# Patient Record
Sex: Female | Born: 1970
Health system: Southern US, Community
[De-identification: ages and names within clinical notes are randomized; demographics above are authoritative.]

## PROBLEM LIST (undated history)

## (undated) ENCOUNTER — Ambulatory Visit: Admission: EM | Payer: Self-pay | Source: Home / Self Care

## (undated) ENCOUNTER — Emergency Department (HOSPITAL_COMMUNITY): Payer: Self-pay | Source: Home / Self Care

## (undated) DIAGNOSIS — J189 Pneumonia, unspecified organism: Secondary | ICD-10-CM

## (undated) DIAGNOSIS — E7800 Pure hypercholesterolemia, unspecified: Secondary | ICD-10-CM

## (undated) DIAGNOSIS — R51 Headache: Secondary | ICD-10-CM

## (undated) DIAGNOSIS — I10 Essential (primary) hypertension: Secondary | ICD-10-CM

## (undated) DIAGNOSIS — E669 Obesity, unspecified: Secondary | ICD-10-CM

## (undated) DIAGNOSIS — E119 Type 2 diabetes mellitus without complications: Secondary | ICD-10-CM

## (undated) DIAGNOSIS — R519 Headache, unspecified: Secondary | ICD-10-CM

---

## 2002-03-07 HISTORY — PX: ABDOMINAL HYSTERECTOMY: SHX81

## 2015-02-14 ENCOUNTER — Encounter (HOSPITAL_COMMUNITY): Payer: Self-pay | Admitting: *Deleted

## 2015-02-14 ENCOUNTER — Emergency Department (HOSPITAL_COMMUNITY): Payer: Medicaid Other

## 2015-02-14 ENCOUNTER — Inpatient Hospital Stay (HOSPITAL_COMMUNITY)
Admission: EM | Admit: 2015-02-14 | Discharge: 2015-02-17 | DRG: 689 | Disposition: A | Discharge status: Home / Self Care | Payer: Medicaid Other | Attending: Internal Medicine | Admitting: Internal Medicine

## 2015-02-14 DIAGNOSIS — E669 Obesity, unspecified: Secondary | ICD-10-CM | POA: Diagnosis present

## 2015-02-14 DIAGNOSIS — I10 Essential (primary) hypertension: Secondary | ICD-10-CM | POA: Diagnosis present

## 2015-02-14 DIAGNOSIS — E1310 Other specified diabetes mellitus with ketoacidosis without coma: Secondary | ICD-10-CM

## 2015-02-14 DIAGNOSIS — K6819 Other retroperitoneal abscess: Secondary | ICD-10-CM | POA: Diagnosis present

## 2015-02-14 DIAGNOSIS — E876 Hypokalemia: Secondary | ICD-10-CM | POA: Diagnosis present

## 2015-02-14 DIAGNOSIS — Z23 Encounter for immunization: Secondary | ICD-10-CM

## 2015-02-14 DIAGNOSIS — N151 Renal and perinephric abscess: Secondary | ICD-10-CM | POA: Diagnosis present

## 2015-02-14 DIAGNOSIS — Z7984 Long term (current) use of oral hypoglycemic drugs: Secondary | ICD-10-CM

## 2015-02-14 DIAGNOSIS — E860 Dehydration: Secondary | ICD-10-CM | POA: Diagnosis present

## 2015-02-14 DIAGNOSIS — Z79899 Other long term (current) drug therapy: Secondary | ICD-10-CM | POA: Diagnosis not present

## 2015-02-14 DIAGNOSIS — E1110 Type 2 diabetes mellitus with ketoacidosis without coma: Secondary | ICD-10-CM

## 2015-02-14 DIAGNOSIS — Z6841 Body Mass Index (BMI) 40.0 and over, adult: Secondary | ICD-10-CM | POA: Diagnosis not present

## 2015-02-14 DIAGNOSIS — E871 Hypo-osmolality and hyponatremia: Secondary | ICD-10-CM

## 2015-02-14 DIAGNOSIS — L0291 Cutaneous abscess, unspecified: Secondary | ICD-10-CM

## 2015-02-14 DIAGNOSIS — E1165 Type 2 diabetes mellitus with hyperglycemia: Secondary | ICD-10-CM

## 2015-02-14 DIAGNOSIS — Z90710 Acquired absence of both cervix and uterus: Secondary | ICD-10-CM

## 2015-02-14 DIAGNOSIS — R109 Unspecified abdominal pain: Secondary | ICD-10-CM | POA: Diagnosis present

## 2015-02-14 HISTORY — DX: Essential (primary) hypertension: I10

## 2015-02-14 HISTORY — DX: Obesity, unspecified: E66.9

## 2015-02-14 LAB — BASIC METABOLIC PANEL
Anion gap: 18 — ABNORMAL HIGH (ref 5–15)
Anion gap: 13 (ref 5–15)
BUN: 6 mg/dL (ref 6–20)
BUN: 5 mg/dL — AB (ref 6–20)
CHLORIDE: 97 mmol/L — AB (ref 101–111)
CO2: 16 mmol/L — AB (ref 22–32)
CO2: 21 mmol/L — AB (ref 22–32)
Calcium: 8.7 mg/dL — ABNORMAL LOW (ref 8.9–10.3)
Calcium: 8.2 mg/dL — ABNORMAL LOW (ref 8.9–10.3)
Chloride: 97 mmol/L — ABNORMAL LOW (ref 101–111)
Creatinine, Ser: 0.96 mg/dL (ref 0.44–1.00)
Creatinine, Ser: 0.64 mg/dL (ref 0.44–1.00)
GFR calc Af Amer: >60 mL/min (ref >60)
GFR calc Af Amer: >60 mL/min (ref >60)
GFR calc non Af Amer: >60 mL/min (ref >60)
GLUCOSE: 259 mg/dL — AB (ref 65–99)
Glucose, Bld: 310 mg/dL — ABNORMAL HIGH (ref 65–99)
POTASSIUM: 3.1 mmol/L — AB (ref 3.5–5.1)
POTASSIUM: 3.2 mmol/L — AB (ref 3.5–5.1)
SODIUM: 131 mmol/L — AB (ref 135–145)
Sodium: 131 mmol/L — ABNORMAL LOW (ref 135–145)

## 2015-02-14 LAB — URINALYSIS, ROUTINE W REFLEX MICROSCOPIC
Hgb urine dipstick: NEGATIVE
Ketones, ur: >80 mg/dL — AB
LEUKOCYTES UA: NEGATIVE
NITRITE: NEGATIVE
PH: 5.5 (ref 5.0–8.0)
Protein, ur: 30 mg/dL — AB
SPECIFIC GRAVITY, URINE: 1.03 (ref 1.005–1.030)
Urobilinogen, UA: 1 mg/dL (ref 0.0–1.0)

## 2015-02-14 LAB — CBC
HEMATOCRIT: 41.2 % (ref 36.0–46.0)
Hemoglobin: 13.8 g/dL (ref 12.0–15.0)
MCH: 30.4 pg (ref 26.0–34.0)
MCHC: 33.5 g/dL (ref 30.0–36.0)
MCV: 90.7 fL (ref 78.0–100.0)
Platelets: 342 10*3/uL (ref 150–400)
RBC: 4.54 MIL/uL (ref 3.87–5.11)
RDW: 12.4 % (ref 11.5–15.5)
WBC: 12.9 10*3/uL — AB (ref 4.0–10.5)

## 2015-02-14 LAB — COMPREHENSIVE METABOLIC PANEL
ALT: 13 U/L — AB (ref 14–54)
AST: 19 U/L (ref 15–41)
Albumin: 2.5 g/dL — ABNORMAL LOW (ref 3.5–5.0)
Alkaline Phosphatase: 166 U/L — ABNORMAL HIGH (ref 38–126)
Anion gap: 21 — ABNORMAL HIGH (ref 5–15)
BUN: 9 mg/dL (ref 6–20)
CHLORIDE: 93 mmol/L — AB (ref 101–111)
CO2: 17 mmol/L — AB (ref 22–32)
Calcium: 9.4 mg/dL (ref 8.9–10.3)
Creatinine, Ser: 0.92 mg/dL (ref 0.44–1.00)
Glucose, Bld: 440 mg/dL — ABNORMAL HIGH (ref 65–99)
POTASSIUM: 4 mmol/L (ref 3.5–5.1)
SODIUM: 131 mmol/L — AB (ref 135–145)
Total Bilirubin: 1.4 mg/dL — ABNORMAL HIGH (ref 0.3–1.2)
Total Protein: 8.8 g/dL — ABNORMAL HIGH (ref 6.5–8.1)

## 2015-02-14 LAB — PROTIME-INR
INR: 1.35 (ref 0.00–1.49)
PROTHROMBIN TIME: 16.8 s — AB (ref 11.6–15.2)

## 2015-02-14 LAB — CBG MONITORING, ED
GLUCOSE-CAPILLARY: 239 mg/dL — AB (ref 65–99)
Glucose-Capillary: 436 mg/dL — ABNORMAL HIGH (ref 65–99)
Glucose-Capillary: 337 mg/dL — ABNORMAL HIGH (ref 65–99)
Glucose-Capillary: 351 mg/dL — ABNORMAL HIGH (ref 65–99)
Glucose-Capillary: 266 mg/dL — ABNORMAL HIGH (ref 65–99)
Glucose-Capillary: 250 mg/dL — ABNORMAL HIGH (ref 65–99)

## 2015-02-14 LAB — URINE MICROSCOPIC-ADD ON

## 2015-02-14 LAB — GLUCOSE, CAPILLARY
GLUCOSE-CAPILLARY: 236 mg/dL — AB (ref 65–99)
Glucose-Capillary: 250 mg/dL — ABNORMAL HIGH (ref 65–99)

## 2015-02-14 LAB — LIPASE, BLOOD: LIPASE: 25 U/L (ref 22–51)

## 2015-02-14 LAB — MAGNESIUM: Magnesium: 1.5 mg/dL — ABNORMAL LOW (ref 1.7–2.4)

## 2015-02-14 LAB — PHOSPHORUS: Phosphorus: 1.9 mg/dL — ABNORMAL LOW (ref 2.5–4.6)

## 2015-02-14 LAB — I-STAT BETA HCG BLOOD, ED (MC, WL, AP ONLY)

## 2015-02-14 MED ORDER — ONDANSETRON HCL 4 MG PO TABS: 4.0000 mg | ORAL_TABLET | Freq: Four times a day (QID) | ORAL | Status: DC | PRN

## 2015-02-14 MED ORDER — SODIUM CHLORIDE 0.9 % IV SOLN
INTRAVENOUS | Status: AC
  Administered 2015-02-14: 18:00:00 via INTRAVENOUS

## 2015-02-14 MED ORDER — ONDANSETRON HCL 4 MG/2ML IJ SOLN: 4.0000 mg | Freq: Four times a day (QID) | INTRAMUSCULAR | Status: DC | PRN

## 2015-02-14 MED ORDER — ACETAMINOPHEN 325 MG PO TABS: 650.0000 mg | ORAL_TABLET | Freq: Four times a day (QID) | ORAL | Status: DC | PRN

## 2015-02-14 MED ORDER — DEXTROSE-NACL 5-0.45 % IV SOLN: INTRAVENOUS | Status: DC

## 2015-02-14 MED ORDER — PIPERACILLIN-TAZOBACTAM 3.375 G IVPB
3.3750 g | Freq: Three times a day (TID) | INTRAVENOUS | Status: DC
  Administered 2015-02-14 – 2015-02-17 (×8): 3.375 g via INTRAVENOUS
  Filled 2015-02-14 (×13): qty 50

## 2015-02-14 MED ORDER — LISINOPRIL 10 MG PO TABS
10.0000 mg | ORAL_TABLET | Freq: Every day | ORAL | Status: DC
  Administered 2015-02-14: 10 mg via ORAL
  Filled 2015-02-14: qty 1

## 2015-02-14 MED ORDER — SODIUM CHLORIDE 0.9 % IV SOLN
INTRAVENOUS | Status: DC
  Administered 2015-02-14: 17:00:00 via INTRAVENOUS

## 2015-02-14 MED ORDER — PIPERACILLIN-TAZOBACTAM 3.375 G IVPB 30 MIN
3.3750 g | Freq: Once | INTRAVENOUS | Status: AC
  Administered 2015-02-14: 3.375 g via INTRAVENOUS
  Filled 2015-02-14: qty 50

## 2015-02-14 MED ORDER — SODIUM CHLORIDE 0.9 % IV SOLN
INTRAVENOUS | Status: DC
  Administered 2015-02-14: 2.1 [IU]/h via INTRAVENOUS
  Filled 2015-02-14: qty 2.5

## 2015-02-14 MED ORDER — HEPARIN SODIUM (PORCINE) 5000 UNIT/ML IJ SOLN
5000.0000 [IU] | Freq: Three times a day (TID) | INTRAMUSCULAR | Status: DC
  Administered 2015-02-14: 5000 [IU] via SUBCUTANEOUS
  Filled 2015-02-14: qty 1

## 2015-02-14 MED ORDER — POTASSIUM CHLORIDE 10 MEQ/100ML IV SOLN
10.0000 meq | INTRAVENOUS | Status: AC
Start: 2015-02-14 — End: 2015-02-14
  Administered 2015-02-14: 10 meq via INTRAVENOUS
  Filled 2015-02-14: qty 100

## 2015-02-14 MED ORDER — SODIUM CHLORIDE 0.9 % IV SOLN
INTRAVENOUS | Status: DC
  Administered 2015-02-14: 8.9 [IU]/h via INTRAVENOUS

## 2015-02-14 MED ORDER — HYDROCHLOROTHIAZIDE 12.5 MG PO CAPS
12.5000 mg | ORAL_CAPSULE | Freq: Every day | ORAL | Status: DC
  Administered 2015-02-14: 12.5 mg via ORAL
  Filled 2015-02-14: qty 1

## 2015-02-14 MED ORDER — IOHEXOL 300 MG/ML  SOLN
100.0000 mL | Freq: Once | INTRAMUSCULAR | Status: AC | PRN
  Administered 2015-02-14: 100 mL via INTRAVENOUS

## 2015-02-14 MED ORDER — DEXTROSE-NACL 5-0.45 % IV SOLN
INTRAVENOUS | Status: DC
  Administered 2015-02-14 – 2015-02-15 (×2): via INTRAVENOUS

## 2015-02-14 MED ORDER — LISINOPRIL-HYDROCHLOROTHIAZIDE 10-12.5 MG PO TABS: 1.0000 | ORAL_TABLET | Freq: Every day | ORAL | Status: DC

## 2015-02-14 MED ORDER — ONDANSETRON HCL 4 MG/2ML IJ SOLN
4.0000 mg | Freq: Once | INTRAMUSCULAR | Status: AC
  Administered 2015-02-14: 4 mg via INTRAVENOUS
  Filled 2015-02-14: qty 2

## 2015-02-14 MED ORDER — MORPHINE SULFATE (PF) 4 MG/ML IV SOLN
4.0000 mg | Freq: Once | INTRAVENOUS | Status: AC
  Administered 2015-02-14: 4 mg via INTRAVENOUS
  Filled 2015-02-14: qty 1

## 2015-02-14 MED ORDER — INSULIN ASPART 100 UNIT/ML ~~LOC~~ SOLN
10.0000 [IU] | Freq: Once | SUBCUTANEOUS | Status: AC
  Administered 2015-02-14: 10 [IU] via SUBCUTANEOUS
  Filled 2015-02-14: qty 1

## 2015-02-14 MED ORDER — ACETAMINOPHEN 650 MG RE SUPP: 650.0000 mg | Freq: Four times a day (QID) | RECTAL | Status: DC | PRN

## 2015-02-14 MED ORDER — OXYCODONE HCL 5 MG PO TABS
5.0000 mg | ORAL_TABLET | ORAL | Status: DC | PRN
  Administered 2015-02-14 – 2015-02-17 (×5): 5 mg via ORAL
  Filled 2015-02-14 (×5): qty 1

## 2015-02-14 NOTE — H&P (Signed)
[facility] History and Physical  Toyoko Silos ZOX:[id number] DOB: 1971-04-25 DOA: 02/14/2015  Referring physician: Dr Patria Mane - [facility] PCP: No primary care provider on file.   Chief Complaint: back pain  HPI: Mandy Garza is a 44 y.o. female  R flank and back pain. Started 3wks ago. Pain is constant with waxing and waning nature. Getting worse. Patient was started on Bactrim 4 days ago without symptoms. Patient states she's been compliant with her metformin and, prior but blood glucose is been around 600. Patient has had very little oral intake over the last couple of days due to persistent nausea. Endorses mild dysuria and frequency and subjective fevers.   Review of Systems:  Constitutional:  No weight loss, night sweats, Fevers HEENT:  No Difficulty swallowing,Tooth/dental problems,Sore throat, Cardio-vascular:  No chest pain, Orthopnea, PND, swelling in lower extremities, anasarca, dizziness, palpitations  GI: Per HPi Resp:   No shortness of breath with exertion or at rest. No excess mucus, no productive cough, No non-productive cough, No coughing up of blood.No change in color of mucus.No wheezing.No chest wall deformity  Skin:  no rash or lesions.  GU: Per HPI Musculoskeletal:   No joint pain or swelling. No decreased range of motion. No back pain.  Psych:  No change in mood or affect. No depression or anxiety. No memory loss.  Neuro:  No change in sensation, unilateral strength, or cognitive abilities  All other systems were reviewed and are negative.  Past Medical History  Diagnosis Date  . Obesity   . Hypertension   . Diabetes mellitus without complication [facility])    Past Surgical History  Procedure Laterality Date  . Abdominal hysterectomy     Social History:  reports that she has never smoked. She does not have any smokeless tobacco history on file. She reports that she does not drink alcohol or use illicit drugs.  No Known  Allergies  Family History  Problem Relation Age of Onset  . Diabetes Father   . Hypertension Father      Prior to Admission medications   Medication Sig Start Date End Date Taking? Authorizing Provider  diclofenac (VOLTAREN) 75 MG EC tablet Take 75 mg by mouth 2 (two) times daily.   Yes Historical Provider, MD  glimepiride (AMARYL) 2 MG tablet Take 2 mg by mouth daily with breakfast.   Yes Historical Provider, MD  lisinopril-hydrochlorothiazide (PRINZIDE,ZESTORETIC) 10-12.5 MG tablet Take 1 tablet by mouth daily.   Yes Historical Provider, MD  metFORMIN (GLUCOPHAGE) 1000 MG tablet Take 1,000 mg by mouth 2 (two) times daily with a meal.   Yes Historical Provider, MD   Physical Exam: Filed Vitals:   02/14/15 1115 02/14/15 1200 02/14/15 1230 02/14/15 1330  BP: 123/83 121/80 116/75 106/71  Pulse: 96 95 93 93  Temp:      TempSrc:      Resp:    16  SpO2: 100% 99% 97% 97%    Wt Readings from Last 3 Encounters:  No data found for Wt    General:  Appears calm and comfortable Eyes:  PERRL, EOMI, normal lids, iris ENT:  grossly normal hearing, lips & tongue Neck:  no LAD, masses or thyromegaly Cardiovascular:  RRR, no m/r/g. No LE edema.  Respiratory:  CTA bilaterally, no w/r/r. Normal respiratory effort. Abdomen:  soft, ntnd Skin:  no rash or induration seen on limited exam Musculoskeletal:  Right-sided CVA tenderness. Upper and lower extremity bilateral strength symmetric. Psychiatric:  grossly normal mood and affect, speech fluent  and appropriate Neurologic:  CN 2-12 grossly intact, moves all extremities in coordinated fashion.          Labs on Admission:  Basic Metabolic Panel:  Recent Labs Lab 02/14/15 1100  NA 131*  K 4.0  CL 93*  CO2 17*  GLUCOSE 440*  BUN 9  CREATININE 0.92  CALCIUM 9.4   Liver Function Tests:  Recent Labs Lab 02/14/15 1100  AST 19  ALT 13*  ALKPHOS 166*  BILITOT 1.4*  PROT 8.8*  ALBUMIN 2.5*    Recent Labs Lab 02/14/15 1100   LIPASE 25   No results for input(s): AMMONIA in the last 168 hours. CBC:  Recent Labs Lab 02/14/15 1100  WBC 12.9*  HGB 13.8  HCT 41.2  MCV 90.7  PLT 342   Cardiac Enzymes: No results for input(s): CKTOTAL, CKMB, CKMBINDEX, TROPONINI in the last 168 hours.  BNP (last 3 results) No results for input(s): BNP in the last 8760 hours.  ProBNP (last 3 results) No results for input(s): PROBNP in the last 8760 hours.  CBG:  Recent Labs Lab 02/14/15 1114 02/14/15 1422 02/14/15 1512 02/14/15 1629  GLUCAP 436* 337* 351* 266*    Radiological Exams on Admission: Ct Abdomen Pelvis Wo Contrast  02/14/2015   CLINICAL DATA:  Right-sided flank pain radiating to the right abdomen. Recent diagnosis of pyelonephritis.  EXAM: CT ABDOMEN AND PELVIS WITHOUT CONTRAST  TECHNIQUE: Multidetector CT imaging of the abdomen and pelvis was performed following the standard protocol without IV contrast.  COMPARISON:  None.  FINDINGS: The lack of intravenous contrast limits the ability to evaluate solid abdominal organs.  There is ill-defined masslike stranding about the posterior aspect of the inferior pole the right kidney with dominant component measuring at least 3.5 x 6.3 x 6.2 cm (as measured in greatest oblique axial - image 47, series 2 ; and coronal - image 65, series 4, dimensions respectively. This ill-defined stranding effaces the adjacent right iliopsoas musculature as well as the posterior inferior aspect of the right kidney. There is minimal pelviectasis involving the superior pole of the right kidney. No renal stones. No discrete areas of osteolysis involving the adjacent vertebral bodies. Adjacent vertebral body heights appear preserved.  Normal noncontrast appearance of the left kidney.  Normal hepatic contour. Normal noncontrast appearance of the gallbladder. No radiopaque gallstones. No ascites.  Note is made of an approximately 1.9 cm nodule within the crux of the right adrenal gland which  measures approximately 23 Hounsfield units and [place] indeterminate on this noncontrast examination (image 29, series 2). Normal noncontrast appearance of the left adrenal gland, pancreas and spleen.  Moderate colonic stool burden without evidence of enteric obstruction. The bowel is normal in course and caliber without wall thickening or evidence of obstruction. Normal noncontrast appearance of the retrocecal appendix. No pneumoperitoneum, pneumatosis or portal venous gas.  Scattered very minimal amount of atherosclerotic plaque within a normal caliber abdominal aorta.  Scattered shotty retroperitoneal lymph nodes are numerous though individually not enlarged by size criteria with index pre aortic lymph node measuring 0.5 cm in greatest short axis diameter. No definitive bulky retroperitoneal, mesenteric, pelvic or inguinal lymphadenopathy on this noncontrast examination.  There is a small amount of air seen within the urinary bladder (image 80, series 3), potentially the sequela of recent in and out Foley catheterization. Otherwise, normal noncontrast appearance of the urinary bladder. Post hysterectomy. No discrete adnexal lesions. No free fluid in the pelvic cul-de-sac.  Limited visualization the lower thorax is  negative for focal airspace opacity or pleural effusion.  Normal heart size.  No pericardial effusion.  No acute or aggressive osseous abnormalities. Stigmata of DISH within the imaged caudal aspect of the thoracic spine.  Regional soft tissues appear normal.  IMPRESSION: 1. Ill-defined masslike stranding involving the posterior inferior aspect the right kidney effacing the adjacent right iliopsoas musculature - while incompletely characterized on this noncontrast examination, given history of recent diagnosis of pyelonephritis, this is favored to represent a developing renal/perinephric abscess but may be further evaluated with contrast-enhanced abdominal CT as clinically indicated. 2. Very mild  pelviectasis involving the superior pole of the right kidney, likely secondary to mass effect from the adjacent infectious/inflammatory process. No renal stones. 3. Incidentally noted approximate nodule within the crux of the right adrenal gland, likely representative of a benign adrenal adenoma though incompletely characterized the present examination. This could be further evaluated with nonemergent MRI as clinically indicated. Critical Value/emergent results were called by telephone at the time of interpretation on 02/14/2015 at 1:16 pm to Dr. Azalia Bilis , who verbally acknowledged these results.   Electronically Signed   By: Simonne Come M.D.   On: 02/14/2015 13:19   Ct Abdomen Pelvis W Contrast  02/14/2015   CLINICAL DATA:  Possible perinephric abscess detected on non contrasted CT scan to further evaluate  EXAM: CT ABDOMEN AND PELVIS WITH CONTRAST  TECHNIQUE: Multidetector CT imaging of the abdomen and pelvis was performed using the standard protocol following bolus administration of intravenous contrast.  CONTRAST:  OMNIPAQUE IOHEXOL 300 MG/ML  SOLN  COMPARISON:  02/14/2015  FINDINGS: Lower chest:  No acute findings.  Hepatobiliary: Mild steatosis  Pancreas: No mass, inflammatory changes, or other significant abnormality.  Spleen: Within normal limits in size and appearance.  Adrenals/Urinary Tract: 19 mm right adrenal nodule shows postcontrast attenuation value of 54. 35 x 63 x 62 mm perinephric soft tissue abnormality on the right, inseparable from and causing mass effect upon the posterior cortex lower pole right kidney, also inseparable from right paraspinous soft tissues and right psoas musculature. Average attenuation value is 35 with numerous mildly enhancing internal septations. Surrounding inflammatory change noted as previously described. No hydronephrosis. Left kidney and adrenal gland normal. Trace air again noted in the bladder as previously described.  Stomach/Bowel: No evidence of  obstruction, inflammatory process, or abnormal fluid collections.  Vascular/Lymphatic: As previously described numerous nonpathologic retroperitoneal lymph nodes noted  Reproductive: No mass or other significant abnormality.  Other: None.  Musculoskeletal:  No suspicious bone lesions identified.  IMPRESSION: 1. Postcontrast images suggesting right perinephric abscess with extrinsic mass effect on the kidney although there is no hydronephrosis appreciated. Would suggest appropriate treatment with follow-up imaging to ensure resolution. 2. Indeterminate right adrenal nodule. As previously recommended, nonemergent MRI suggested.   Electronically Signed   By: Esperanza Heir M.D.   On: 02/14/2015 14:17      Assessment/Plan Principal Problem:   Perinephric abscess Active Problems:   Poorly controlled diabetes mellitus (HCC)   DKA (diabetic ketoacidoses) (HCC)   Hyponatremia   Essential hypertension   Perinephric abscess: Abscess is noted on CT. ED physician consult and urology and ventral radiology who graciously agreed to place a drain on 02/15/2015. Patient initially treated for UTI with Bactrim. - Tele - Nothing by mouth after midnight - Hold Heperin after 2200 - Follow-up IR recommendations and procedure - continue zosyn then taper coverage after drain - BCX - UCX  Diabetes: DKA. Anion gap 25. Bicarb 17.  Never previously on injectables but likely to need them after this hospitalization - glucose stabilizer - A1c - hold home metformin and glimepiride.   Hyponatremia: 131 on admission but corrects to 135 given glucose of 440.  - IVF - f/u BMET  HTN: - continue home HCTZ and lisinopril   Code Status: FULL  DVT Prophylaxis: Hep - SCD Family Communication: None Disposition Plan:  Pending Improvement    MERRELL, DAVID Shela Commons, MD Family Medicine [facility] [email or web address] Password [email or web address]

## 2015-02-14 NOTE — ED Notes (Signed)
Pt reports lower back pain that radiates around to lower abd and causes burning pain x 3 weeks.  Denies pain with urination but does having n/v when pain occurs. Went to pcp and started on medications 1 week ago but no relief.

## 2015-02-14 NOTE — ED Provider Notes (Signed)
CSN: [id number]     Arrival date & time 02/14/15  1041 History   First MD Initiated Contact with Patient 02/14/15 1109     Chief Complaint  Patient presents with  . Abdominal Pain  . Back Pain      HPI Presents to emergency department complaining of right flank pain x3 weeks. Started on bactrim 1 week ago for UTI. Ongoing subjective fevers at home and ongoing right flank pain. No hx of stones. Reports nausea and vomiting without diarrhea. Pt is a diabetic on metformin and she states her blood sugar yesterday was >600. Pain is moderate in severity. No CP or SOB. Some urinary frequency without dysuria     Past Medical History  Diagnosis Date  . Obesity   . Hypertension   . Diabetes mellitus without complication [facility])    Past Surgical History  Procedure Laterality Date  . Abdominal hysterectomy     History reviewed. No pertinent family history. Social History  Substance Use Topics  . Smoking status: Never Smoker   . Smokeless tobacco: None  . Alcohol Use: No   OB History    No data available     Review of Systems  All other systems reviewed and are negative.     Allergies  Review of patient's allergies indicates no known allergies.  Home Medications   Prior to Admission medications   Medication Sig Start Date End Date Taking? Authorizing Provider  diclofenac (VOLTAREN) 75 MG EC tablet Take 75 mg by mouth 2 (two) times daily.   Yes Historical Provider, MD  glimepiride (AMARYL) 2 MG tablet Take 2 mg by mouth daily with breakfast.   Yes Historical Provider, MD  lisinopril-hydrochlorothiazide (PRINZIDE,ZESTORETIC) 10-12.5 MG tablet Take 1 tablet by mouth daily.   Yes Historical Provider, MD  metFORMIN (GLUCOPHAGE) 1000 MG tablet Take 1,000 mg by mouth 2 (two) times daily with a meal.   Yes Historical Provider, MD   BP 106/71 mmHg  Pulse 93  Temp(Src) 98.5 F (36.9 C) (Oral)  Resp 16  SpO2 97% Physical Exam  Constitutional: She is oriented to person, place, and  time. She appears well-developed and well-nourished. No distress.  HENT:  Head: Normocephalic and atraumatic.  Eyes: EOM are normal.  Neck: Normal range of motion.  Cardiovascular: Normal rate, regular rhythm and normal heart sounds.   Pulmonary/Chest: Effort normal and breath sounds normal.  Abdominal: Soft. She exhibits no distension.  Mild right sided abdominal tenderness without guarding or rebound  Musculoskeletal: Normal range of motion.  Neurological: She is alert and oriented to person, place, and time.  Skin: Skin is warm and dry.  Psychiatric: She has a normal mood and affect. Judgment normal.  Nursing note and vitals reviewed.   ED Course  Procedures (including critical care time) Labs Review Labs Reviewed  COMPREHENSIVE METABOLIC PANEL - Abnormal; Notable for the following:    Sodium 131 (*)    Chloride 93 (*)    CO2 17 (*)    Glucose, Bld 440 (*)    Total Protein 8.8 (*)    Albumin 2.5 (*)    ALT 13 (*)    Alkaline Phosphatase 166 (*)    Total Bilirubin 1.4 (*)    Anion gap 21 (*)    All other components within normal limits  CBC - Abnormal; Notable for the following:    WBC 12.9 (*)    All other components within normal limits  URINALYSIS, ROUTINE W REFLEX MICROSCOPIC (NOT AT [facility]) - Abnormal;  Notable for the following:    Glucose, UA >1000 (*)    Bilirubin Urine MODERATE (*)    Ketones, ur >80 (*)    Protein, ur 30 (*)    All other components within normal limits  URINE MICROSCOPIC-ADD ON - Abnormal; Notable for the following:    Squamous Epithelial / LPF MANY (*)    Bacteria, UA FEW (*)    Casts GRANULAR CAST (*)    All other components within normal limits  CBG MONITORING, ED - Abnormal; Notable for the following:    Glucose-Capillary 436 (*)    All other components within normal limits  CBG MONITORING, ED - Abnormal; Notable for the following:    Glucose-Capillary 337 (*)    All other components within normal limits  CBG MONITORING, ED - Abnormal;  Notable for the following:    Glucose-Capillary 351 (*)    All other components within normal limits  URINE CULTURE  LIPASE, BLOOD  I-STAT BETA HCG BLOOD, ED ([facility], [facility], [facility] ONLY)    Imaging Review Ct Abdomen Pelvis Wo Contrast  02/14/2015   CLINICAL DATA:  Right-sided flank pain radiating to the right abdomen. Recent diagnosis of pyelonephritis.  EXAM: CT ABDOMEN AND PELVIS WITHOUT CONTRAST  TECHNIQUE: Multidetector CT imaging of the abdomen and pelvis was performed following the standard protocol without IV contrast.  COMPARISON:  None.  FINDINGS: The lack of intravenous contrast limits the ability to evaluate solid abdominal organs.  There is ill-defined masslike stranding about the posterior aspect of the inferior pole the right kidney with dominant component measuring at least 3.5 x 6.3 x 6.2 cm (as measured in greatest oblique axial - image 47, series 2 ; and coronal - image 65, series 4, dimensions respectively. This ill-defined stranding effaces the adjacent right iliopsoas musculature as well as the posterior inferior aspect of the right kidney. There is minimal pelviectasis involving the superior pole of the right kidney. No renal stones. No discrete areas of osteolysis involving the adjacent vertebral bodies. Adjacent vertebral body heights appear preserved.  Normal noncontrast appearance of the left kidney.  Normal hepatic contour. Normal noncontrast appearance of the gallbladder. No radiopaque gallstones. No ascites.  Note is made of an approximately 1.9 cm nodule within the crux of the right adrenal gland which measures approximately 23 Hounsfield units and [place] indeterminate on this noncontrast examination (image 29, series 2). Normal noncontrast appearance of the left adrenal gland, pancreas and spleen.  Moderate colonic stool burden without evidence of enteric obstruction. The bowel is normal in course and caliber without wall thickening or evidence of obstruction. Normal noncontrast appearance  of the retrocecal appendix. No pneumoperitoneum, pneumatosis or portal venous gas.  Scattered very minimal amount of atherosclerotic plaque within a normal caliber abdominal aorta.  Scattered shotty retroperitoneal lymph nodes are numerous though individually not enlarged by size criteria with index pre aortic lymph node measuring 0.5 cm in greatest short axis diameter. No definitive bulky retroperitoneal, mesenteric, pelvic or inguinal lymphadenopathy on this noncontrast examination.  There is a small amount of air seen within the urinary bladder (image 80, series 3), potentially the sequela of recent in and out Foley catheterization. Otherwise, normal noncontrast appearance of the urinary bladder. Post hysterectomy. No discrete adnexal lesions. No free fluid in the pelvic cul-de-sac.  Limited visualization the lower thorax is negative for focal airspace opacity or pleural effusion.  Normal heart size.  No pericardial effusion.  No acute or aggressive osseous abnormalities. Stigmata of DISH within the imaged  caudal aspect of the thoracic spine.  Regional soft tissues appear normal.  IMPRESSION: 1. Ill-defined masslike stranding involving the posterior inferior aspect the right kidney effacing the adjacent right iliopsoas musculature - while incompletely characterized on this noncontrast examination, given history of recent diagnosis of pyelonephritis, this is favored to represent a developing renal/perinephric abscess but may be further evaluated with contrast-enhanced abdominal CT as clinically indicated. 2. Very mild pelviectasis involving the superior pole of the right kidney, likely secondary to mass effect from the adjacent infectious/inflammatory process. No renal stones. 3. Incidentally noted approximate nodule within the crux of the right adrenal gland, likely representative of a benign adrenal adenoma though incompletely characterized the present examination. This could be further evaluated with  nonemergent MRI as clinically indicated. Critical Value/emergent results were called by telephone at the time of interpretation on 02/14/2015 at 1:16 pm to Dr. Azalia Bilis , who verbally acknowledged these results.   Electronically Signed   By: Simonne Come M.D.   On: 02/14/2015 13:19   Ct Abdomen Pelvis W Contrast  02/14/2015   CLINICAL DATA:  Possible perinephric abscess detected on non contrasted CT scan to further evaluate  EXAM: CT ABDOMEN AND PELVIS WITH CONTRAST  TECHNIQUE: Multidetector CT imaging of the abdomen and pelvis was performed using the standard protocol following bolus administration of intravenous contrast.  CONTRAST:  OMNIPAQUE IOHEXOL 300 MG/ML  SOLN  COMPARISON:  02/14/2015  FINDINGS: Lower chest:  No acute findings.  Hepatobiliary: Mild steatosis  Pancreas: No mass, inflammatory changes, or other significant abnormality.  Spleen: Within normal limits in size and appearance.  Adrenals/Urinary Tract: 19 mm right adrenal nodule shows postcontrast attenuation value of 54. 35 x 63 x 62 mm perinephric soft tissue abnormality on the right, inseparable from and causing mass effect upon the posterior cortex lower pole right kidney, also inseparable from right paraspinous soft tissues and right psoas musculature. Average attenuation value is 35 with numerous mildly enhancing internal septations. Surrounding inflammatory change noted as previously described. No hydronephrosis. Left kidney and adrenal gland normal. Trace air again noted in the bladder as previously described.  Stomach/Bowel: No evidence of obstruction, inflammatory process, or abnormal fluid collections.  Vascular/Lymphatic: As previously described numerous nonpathologic retroperitoneal lymph nodes noted  Reproductive: No mass or other significant abnormality.  Other: None.  Musculoskeletal:  No suspicious bone lesions identified.  IMPRESSION: 1. Postcontrast images suggesting right perinephric abscess with extrinsic mass  effect on the kidney although there is no hydronephrosis appreciated. Would suggest appropriate treatment with follow-up imaging to ensure resolution. 2. Indeterminate right adrenal nodule. As previously recommended, nonemergent MRI suggested.   Electronically Signed   By: Esperanza Heir M.D.   On: 02/14/2015 14:17   I have personally reviewed and evaluated these images and lab results as part of my medical decision-making.   EKG Interpretation None      MDM   Final diagnoses:  Perinephric abscess   hypergylcemia with right perinephric abscess. IV zosyn. Urine culture sent. Anion gap 21. IVFs and IV insulin. Will admit to the hospital    Azalia Bilis, MD 02/14/15 [phone]

## 2015-02-14 NOTE — ED Notes (Signed)
Paged Shelly Flatten to [place], RN

## 2015-02-15 ENCOUNTER — Inpatient Hospital Stay (HOSPITAL_COMMUNITY): Payer: Medicaid Other

## 2015-02-15 ENCOUNTER — Encounter (HOSPITAL_COMMUNITY): Payer: Self-pay | Admitting: Radiology

## 2015-02-15 DIAGNOSIS — N151 Renal and perinephric abscess: Principal | ICD-10-CM

## 2015-02-15 DIAGNOSIS — E0810 Diabetes mellitus due to underlying condition with ketoacidosis without coma: Secondary | ICD-10-CM

## 2015-02-15 DIAGNOSIS — E871 Hypo-osmolality and hyponatremia: Secondary | ICD-10-CM

## 2015-02-15 DIAGNOSIS — I10 Essential (primary) hypertension: Secondary | ICD-10-CM

## 2015-02-15 LAB — BASIC METABOLIC PANEL
ANION GAP: 14 (ref 5–15)
Anion gap: 12 (ref 5–15)
Anion gap: 13 (ref 5–15)
BUN: <5 mg/dL — ABNORMAL LOW (ref 6–20)
BUN: <5 mg/dL — ABNORMAL LOW (ref 6–20)
BUN: 6 mg/dL (ref 6–20)
CHLORIDE: 100 mmol/L — AB (ref 101–111)
CHLORIDE: 98 mmol/L — AB (ref 101–111)
CHLORIDE: 99 mmol/L — AB (ref 101–111)
CO2: 22 mmol/L (ref 22–32)
CO2: 20 mmol/L — ABNORMAL LOW (ref 22–32)
CO2: 20 mmol/L — ABNORMAL LOW (ref 22–32)
CREATININE: 0.55 mg/dL (ref 0.44–1.00)
CREATININE: 0.56 mg/dL (ref 0.44–1.00)
Calcium: 8.4 mg/dL — ABNORMAL LOW (ref 8.9–10.3)
Calcium: 8.4 mg/dL — ABNORMAL LOW (ref 8.9–10.3)
Calcium: 8.4 mg/dL — ABNORMAL LOW (ref 8.9–10.3)
Creatinine, Ser: 0.57 mg/dL (ref 0.44–1.00)
GFR calc non Af Amer: >60 mL/min (ref >60)
GFR calc non Af Amer: >60 mL/min (ref >60)
GFR calc non Af Amer: >60 mL/min (ref >60)
Glucose, Bld: 154 mg/dL — ABNORMAL HIGH (ref 65–99)
Glucose, Bld: 149 mg/dL — ABNORMAL HIGH (ref 65–99)
Glucose, Bld: 137 mg/dL — ABNORMAL HIGH (ref 65–99)
POTASSIUM: 2.9 mmol/L — AB (ref 3.5–5.1)
POTASSIUM: 3.3 mmol/L — AB (ref 3.5–5.1)
Potassium: 2.8 mmol/L — ABNORMAL LOW (ref 3.5–5.1)
SODIUM: 132 mmol/L — AB (ref 135–145)
SODIUM: 133 mmol/L — AB (ref 135–145)
SODIUM: 133 mmol/L — AB (ref 135–145)

## 2015-02-15 LAB — CBC
HCT: 36.9 % (ref 36.0–46.0)
HEMOGLOBIN: 12.6 g/dL (ref 12.0–15.0)
MCH: 30.4 pg (ref 26.0–34.0)
MCHC: 34.1 g/dL (ref 30.0–36.0)
MCV: 89.1 fL (ref 78.0–100.0)
Platelets: 297 10*3/uL (ref 150–400)
RBC: 4.14 MIL/uL (ref 3.87–5.11)
RDW: 12.5 % (ref 11.5–15.5)
WBC: 10.3 10*3/uL (ref 4.0–10.5)

## 2015-02-15 LAB — GLUCOSE, CAPILLARY
GLUCOSE-CAPILLARY: 238 mg/dL — AB (ref 65–99)
GLUCOSE-CAPILLARY: 170 mg/dL — AB (ref 65–99)
GLUCOSE-CAPILLARY: 156 mg/dL — AB (ref 65–99)
GLUCOSE-CAPILLARY: 152 mg/dL — AB (ref 65–99)
GLUCOSE-CAPILLARY: 165 mg/dL — AB (ref 65–99)
GLUCOSE-CAPILLARY: 146 mg/dL — AB (ref 65–99)
GLUCOSE-CAPILLARY: 150 mg/dL — AB (ref 65–99)
GLUCOSE-CAPILLARY: 167 mg/dL — AB (ref 65–99)
GLUCOSE-CAPILLARY: 291 mg/dL — AB (ref 65–99)
Glucose-Capillary: 135 mg/dL — ABNORMAL HIGH (ref 65–99)
Glucose-Capillary: 150 mg/dL — ABNORMAL HIGH (ref 65–99)
Glucose-Capillary: 153 mg/dL — ABNORMAL HIGH (ref 65–99)
Glucose-Capillary: 159 mg/dL — ABNORMAL HIGH (ref 65–99)
Glucose-Capillary: 209 mg/dL — ABNORMAL HIGH (ref 65–99)
Glucose-Capillary: 321 mg/dL — ABNORMAL HIGH (ref 65–99)

## 2015-02-15 LAB — COMPREHENSIVE METABOLIC PANEL
ALBUMIN: 2 g/dL — AB (ref 3.5–5.0)
ALT: 12 U/L — ABNORMAL LOW (ref 14–54)
ANION GAP: 10 (ref 5–15)
AST: 19 U/L (ref 15–41)
Alkaline Phosphatase: 153 U/L — ABNORMAL HIGH (ref 38–126)
BILIRUBIN TOTAL: 0.5 mg/dL (ref 0.3–1.2)
BUN: <5 mg/dL — ABNORMAL LOW (ref 6–20)
CO2: 25 mmol/L (ref 22–32)
Calcium: 8.3 mg/dL — ABNORMAL LOW (ref 8.9–10.3)
Chloride: 98 mmol/L — ABNORMAL LOW (ref 101–111)
Creatinine, Ser: 0.58 mg/dL (ref 0.44–1.00)
Glucose, Bld: 140 mg/dL — ABNORMAL HIGH (ref 65–99)
POTASSIUM: 2.7 mmol/L — AB (ref 3.5–5.1)
Sodium: 133 mmol/L — ABNORMAL LOW (ref 135–145)
TOTAL PROTEIN: 7.3 g/dL (ref 6.5–8.1)

## 2015-02-15 LAB — MAGNESIUM: Magnesium: 1.6 mg/dL — ABNORMAL LOW (ref 1.7–2.4)

## 2015-02-15 LAB — URINE CULTURE

## 2015-02-15 MED ORDER — INSULIN ASPART 100 UNIT/ML ~~LOC~~ SOLN
0.0000 [IU] | Freq: Three times a day (TID) | SUBCUTANEOUS | Status: DC
  Administered 2015-02-15: 7 [IU] via SUBCUTANEOUS
  Administered 2015-02-15: 1 [IU] via SUBCUTANEOUS
  Administered 2015-02-16 (×2): 7 [IU] via SUBCUTANEOUS

## 2015-02-15 MED ORDER — MIDAZOLAM HCL 2 MG/2ML IJ SOLN
INTRAMUSCULAR | Status: AC | PRN
  Administered 2015-02-15: 1 mg via INTRAVENOUS

## 2015-02-15 MED ORDER — HEPARIN SODIUM (PORCINE) 5000 UNIT/ML IJ SOLN
5000.0000 [IU] | Freq: Three times a day (TID) | INTRAMUSCULAR | Status: DC
  Administered 2015-02-15 – 2015-02-17 (×6): 5000 [IU] via SUBCUTANEOUS
  Filled 2015-02-15 (×5): qty 1

## 2015-02-15 MED ORDER — LIDOCAINE HCL 1 % IJ SOLN
INTRAMUSCULAR | Status: AC
  Filled 2015-02-15: qty 20

## 2015-02-15 MED ORDER — SODIUM CHLORIDE 0.9 % IV SOLN
INTRAVENOUS | Status: DC
  Administered 2015-02-15: 10:00:00 via INTRAVENOUS
  Filled 2015-02-15 (×5): qty 1000

## 2015-02-15 MED ORDER — FENTANYL CITRATE (PF) 100 MCG/2ML IJ SOLN
INTRAMUSCULAR | Status: AC
  Filled 2015-02-15: qty 2

## 2015-02-15 MED ORDER — MIDAZOLAM HCL 2 MG/2ML IJ SOLN
INTRAMUSCULAR | Status: AC
Start: 2015-02-15 — End: 2015-02-15
  Filled 2015-02-15: qty 2

## 2015-02-15 MED ORDER — FENTANYL CITRATE (PF) 100 MCG/2ML IJ SOLN
INTRAMUSCULAR | Status: AC | PRN
  Administered 2015-02-15: 50 ug via INTRAVENOUS

## 2015-02-15 MED ORDER — MAGNESIUM SULFATE 2 GM/50ML IV SOLN
2.0000 g | Freq: Once | INTRAVENOUS | Status: AC
  Administered 2015-02-15: 2 g via INTRAVENOUS
  Filled 2015-02-15 (×2): qty 50

## 2015-02-15 MED ORDER — POTASSIUM CHLORIDE CRYS ER 20 MEQ PO TBCR
40.0000 meq | EXTENDED_RELEASE_TABLET | Freq: Three times a day (TID) | ORAL | Status: AC
  Administered 2015-02-15 – 2015-02-16 (×3): 40 meq via ORAL
  Filled 2015-02-15 (×2): qty 2

## 2015-02-15 MED ORDER — INSULIN REGULAR HUMAN 100 UNIT/ML IJ SOLN
INTRAMUSCULAR | Status: DC
  Filled 2015-02-15: qty 2.5

## 2015-02-15 MED ORDER — INSULIN GLARGINE 100 UNIT/ML ~~LOC~~ SOLN
20.0000 [IU] | Freq: Every day | SUBCUTANEOUS | Status: DC
  Administered 2015-02-15: 20 [IU] via SUBCUTANEOUS
  Filled 2015-02-15 (×4): qty 0.2

## 2015-02-15 NOTE — Procedures (Signed)
Interventional Radiology Procedure Note  Procedure:  CT guided drainage of right perinephric/retroperitoneal abscess  Complications:  None  Estimated Blood Loss: < 10 mL  Grossly purulent, thick fluid aspirated from posterior right perinephric abscess. 12 Fr drain placed.  Attached to suction bulb. Will follow output. Recommend ID consultation.  If patient discharged with drain, we can follow her in IR Clinic. Will follow.  Jodi Marble. Fredia Sorrow, M.D Pager:  [phone]

## 2015-02-15 NOTE — Progress Notes (Signed)
CRITICAL VALUE ALERT  Critical value received:  K 2.7  Date of notification:  02/15/15  Time of notification:  1242  Critical value read back:Yes.    Nurse who received alert:  Lujean Amel  MD notified (1st page):  N.Abrol  Time of first page:  1244  Responding MD:  N.Abrol   Time MD responded:  1246- New orders placed.

## 2015-02-15 NOTE — Sedation Documentation (Signed)
Patient is resting comfortably. 

## 2015-02-15 NOTE — Progress Notes (Signed)
[facility] PROGRESS NOTE  Mandy Garza VWU:[id number] DOB: 07-Nov-1970 DOA: 02/14/2015 PCP: No primary care provider on file.  Length of stay: 1   Assessment/Plan: Principal Problem:   Perinephric abscess Active Problems:   Poorly controlled diabetes mellitus (HCC)   DKA (diabetic ketoacidoses) (HCC)   Hyponatremia   Essential hypertension    Perinephric abscess-continue IV Zosyn, continued follow cultures, continue IV hydration, interventional radiology to place a drain, to drain the abscess today  Diabetic ketoacidosis-anion gap closed, will check hemoglobin A1c, discontinue insulin drip in 2-3 hours, will give 1 dose of Lantus now  Hypokalemia-replete and check magnesium level  Hyponatremia secondary to dehydration, now improving with IV fluids  Hypertension discontinue antihypertensives given soft blood pressure   DVT prophylaxsis , start heparin after procedure   Code Status:      Code Status Orders        Start     Ordered   02/14/15 1637  Full code   Continuous     02/14/15 1637     Family Communication: family updated about patient's clinical progress Disposition Plan:   anticipate discharge in one to 2 days    Brief narrative: 44 y.o. female  R flank and back pain. Started 3wks ago. Pain is constant with waxing and waning nature. Getting worse. Patient was started on Bactrim 4 days ago without symptoms. Patient states she's been compliant with her metformin and, prior but blood glucose is been around 600. Patient has had very little oral intake over the last couple of days due to persistent nausea. Endorses mild dysuria and frequency and subjective fevers  Consultants:  Interventional radiology  Procedures:  None  Antibiotics: Anti-infectives    Start     Dose/Rate Route Frequency Ordered Stop   02/14/15 2200  piperacillin-tazobactam (ZOSYN) IVPB 3.375 g     3.375 g 12.5 mL/hr over 240 Minutes Intravenous 3 times per day  02/14/15 2146     02/14/15 1330  piperacillin-tazobactam (ZOSYN) IVPB 3.375 g     3.375 g 100 mL/hr over 30 Minutes Intravenous  Once 02/14/15 1320 02/14/15 1504         HPI/Subjective: Patient complaining of mild nausea and right flank pain, afebrile overnight  Objective: Filed Vitals:   02/14/15 1830 02/14/15 1845 02/14/15 1947 02/15/15 0445  BP:   118/67 92/52  Pulse: 92 93 93 95  Temp:   98.5 F (36.9 C) 98.5 F (36.9 C)  TempSrc:   Oral Oral  Resp:   18 19  Height:   4\' 8"  (1.422 m)   Weight:   99.6 kg (219 lb 9.3 oz)   SpO2:   96% 99%    Intake/Output Summary (Last 24 hours) at 02/15/15 0956 Last data filed at 02/15/15 0600  Gross per 24 hour  Intake 1766.73 ml  Output      0 ml  Net 1766.73 ml    Exam:  General: No acute respiratory distress Lungs: Clear to auscultation bilaterally without wheezes or crackles Cardiovascular: Regular rate and rhythm without murmur gallop or rub normal S1 and S2 Abdomen: Nontender, nondistended, soft, bowel sounds positive, no rebound, no ascites, no appreciable mass Extremities: No significant cyanosis, clubbing, or edema bilateral lower extremities     Data Review   Micro Results No results found for this or any previous visit (from the past 240 hour(s)).  Radiology Reports Ct Abdomen Pelvis Wo Contrast  02/14/2015   CLINICAL DATA:  Right-sided flank pain  radiating to the right abdomen. Recent diagnosis of pyelonephritis.  EXAM: CT ABDOMEN AND PELVIS WITHOUT CONTRAST  TECHNIQUE: Multidetector CT imaging of the abdomen and pelvis was performed following the standard protocol without IV contrast.  COMPARISON:  None.  FINDINGS: The lack of intravenous contrast limits the ability to evaluate solid abdominal organs.  There is ill-defined masslike stranding about the posterior aspect of the inferior pole the right kidney with dominant component measuring at least 3.5 x 6.3 x 6.2 cm (as measured in greatest oblique axial - image  47, series 2 ; and coronal - image 65, series 4, dimensions respectively. This ill-defined stranding effaces the adjacent right iliopsoas musculature as well as the posterior inferior aspect of the right kidney. There is minimal pelviectasis involving the superior pole of the right kidney. No renal stones. No discrete areas of osteolysis involving the adjacent vertebral bodies. Adjacent vertebral body heights appear preserved.  Normal noncontrast appearance of the left kidney.  Normal hepatic contour. Normal noncontrast appearance of the gallbladder. No radiopaque gallstones. No ascites.  Note is made of an approximately 1.9 cm nodule within the crux of the right adrenal gland which measures approximately 23 Hounsfield units and [place] indeterminate on this noncontrast examination (image 29, series 2). Normal noncontrast appearance of the left adrenal gland, pancreas and spleen.  Moderate colonic stool burden without evidence of enteric obstruction. The bowel is normal in course and caliber without wall thickening or evidence of obstruction. Normal noncontrast appearance of the retrocecal appendix. No pneumoperitoneum, pneumatosis or portal venous gas.  Scattered very minimal amount of atherosclerotic plaque within a normal caliber abdominal aorta.  Scattered shotty retroperitoneal lymph nodes are numerous though individually not enlarged by size criteria with index pre aortic lymph node measuring 0.5 cm in greatest short axis diameter. No definitive bulky retroperitoneal, mesenteric, pelvic or inguinal lymphadenopathy on this noncontrast examination.  There is a small amount of air seen within the urinary bladder (image 80, series 3), potentially the sequela of recent in and out Foley catheterization. Otherwise, normal noncontrast appearance of the urinary bladder. Post hysterectomy. No discrete adnexal lesions. No free fluid in the pelvic cul-de-sac.  Limited visualization the lower thorax is negative for focal  airspace opacity or pleural effusion.  Normal heart size.  No pericardial effusion.  No acute or aggressive osseous abnormalities. Stigmata of DISH within the imaged caudal aspect of the thoracic spine.  Regional soft tissues appear normal.  IMPRESSION: 1. Ill-defined masslike stranding involving the posterior inferior aspect the right kidney effacing the adjacent right iliopsoas musculature - while incompletely characterized on this noncontrast examination, given history of recent diagnosis of pyelonephritis, this is favored to represent a developing renal/perinephric abscess but may be further evaluated with contrast-enhanced abdominal CT as clinically indicated. 2. Very mild pelviectasis involving the superior pole of the right kidney, likely secondary to mass effect from the adjacent infectious/inflammatory process. No renal stones. 3. Incidentally noted approximate nodule within the crux of the right adrenal gland, likely representative of a benign adrenal adenoma though incompletely characterized the present examination. This could be further evaluated with nonemergent MRI as clinically indicated. Critical Value/emergent results were called by telephone at the time of interpretation on 02/14/2015 at 1:16 pm to Dr. Azalia Bilis , who verbally acknowledged these results.   Electronically Signed   By: Simonne Come M.D.   On: 02/14/2015 13:19   Ct Abdomen Pelvis W Contrast  02/14/2015   CLINICAL DATA:  Possible perinephric abscess detected on non contrasted  CT scan to further evaluate  EXAM: CT ABDOMEN AND PELVIS WITH CONTRAST  TECHNIQUE: Multidetector CT imaging of the abdomen and pelvis was performed using the standard protocol following bolus administration of intravenous contrast.  CONTRAST:  OMNIPAQUE IOHEXOL 300 MG/ML  SOLN  COMPARISON:  02/14/2015  FINDINGS: Lower chest:  No acute findings.  Hepatobiliary: Mild steatosis  Pancreas: No mass, inflammatory changes, or other significant abnormality.   Spleen: Within normal limits in size and appearance.  Adrenals/Urinary Tract: 19 mm right adrenal nodule shows postcontrast attenuation value of 54. 35 x 63 x 62 mm perinephric soft tissue abnormality on the right, inseparable from and causing mass effect upon the posterior cortex lower pole right kidney, also inseparable from right paraspinous soft tissues and right psoas musculature. Average attenuation value is 35 with numerous mildly enhancing internal septations. Surrounding inflammatory change noted as previously described. No hydronephrosis. Left kidney and adrenal gland normal. Trace air again noted in the bladder as previously described.  Stomach/Bowel: No evidence of obstruction, inflammatory process, or abnormal fluid collections.  Vascular/Lymphatic: As previously described numerous nonpathologic retroperitoneal lymph nodes noted  Reproductive: No mass or other significant abnormality.  Other: None.  Musculoskeletal:  No suspicious bone lesions identified.  IMPRESSION: 1. Postcontrast images suggesting right perinephric abscess with extrinsic mass effect on the kidney although there is no hydronephrosis appreciated. Would suggest appropriate treatment with follow-up imaging to ensure resolution. 2. Indeterminate right adrenal nodule. As previously recommended, nonemergent MRI suggested.   Electronically Signed   By: Esperanza Heir M.D.   On: 02/14/2015 14:17     CBC  Recent Labs Lab 02/14/15 1100 02/15/15 0431  WBC 12.9* 10.3  HGB 13.8 12.6  HCT 41.2 36.9  PLT 342 297  MCV 90.7 89.1  MCH 30.4 30.4  MCHC 33.5 34.1  RDW 12.4 12.5    Chemistries   Recent Labs Lab 02/14/15 1100 02/14/15 1641 02/14/15 1642 02/14/15 2025 02/15/15 0013 02/15/15 0431  NA 131* 131*  --  131* 132* 133*  K 4.0 3.1*  --  3.2* 2.9* 2.8*  CL 93* 97*  --  97* 98* 100*  CO2 17* 16*  --  21* 22 20*  GLUCOSE 440* 310*  --  259* 154* 149*  BUN 9 6  --  5* <5* <5*  CREATININE 0.92 0.96  --  0.64 0.55 0.56   CALCIUM 9.4 8.7*  --  8.2* 8.4* 8.4*  MG  --   --  1.5*  --   --   --   AST 19  --   --   --   --   --   ALT 13*  --   --   --   --   --   ALKPHOS 166*  --   --   --   --   --   BILITOT 1.4*  --   --   --   --   --    ------------------------------------------------------------------------------------------------------------------ estimated creatinine clearance is 88.2 mL/min (by C-G formula based on Cr of 0.56). ------------------------------------------------------------------------------------------------------------------ No results for input(s): HGBA1C in the last 72 hours. ------------------------------------------------------------------------------------------------------------------ No results for input(s): CHOL, HDL, LDLCALC, TRIG, CHOLHDL, LDLDIRECT in the last 72 hours. ------------------------------------------------------------------------------------------------------------------ No results for input(s): TSH, T4TOTAL, T3FREE, THYROIDAB in the last 72 hours.  Invalid input(s): FREET3 ------------------------------------------------------------------------------------------------------------------ No results for input(s): VITAMINB12, FOLATE, FERRITIN, TIBC, IRON, RETICCTPCT in the last 72 hours.  Coagulation profile  Recent Labs Lab 02/14/15 1641  INR 1.35  No results for input(s): DDIMER in the last 72 hours.  Cardiac Enzymes No results for input(s): CKMB, TROPONINI, MYOGLOBIN in the last 168 hours.  Invalid input(s): CK ------------------------------------------------------------------------------------------------------------------ Invalid input(s): POCBNP   CBG:  Recent Labs Lab 02/15/15 0440 02/15/15 0541 02/15/15 0643 02/15/15 0743 02/15/15 0851  GLUCAP 152* 165* 146* 150* 167*       Studies: Ct Abdomen Pelvis Wo Contrast  02/14/2015   CLINICAL DATA:  Right-sided flank pain radiating to the right abdomen. Recent diagnosis of  pyelonephritis.  EXAM: CT ABDOMEN AND PELVIS WITHOUT CONTRAST  TECHNIQUE: Multidetector CT imaging of the abdomen and pelvis was performed following the standard protocol without IV contrast.  COMPARISON:  None.  FINDINGS: The lack of intravenous contrast limits the ability to evaluate solid abdominal organs.  There is ill-defined masslike stranding about the posterior aspect of the inferior pole the right kidney with dominant component measuring at least 3.5 x 6.3 x 6.2 cm (as measured in greatest oblique axial - image 47, series 2 ; and coronal - image 65, series 4, dimensions respectively. This ill-defined stranding effaces the adjacent right iliopsoas musculature as well as the posterior inferior aspect of the right kidney. There is minimal pelviectasis involving the superior pole of the right kidney. No renal stones. No discrete areas of osteolysis involving the adjacent vertebral bodies. Adjacent vertebral body heights appear preserved.  Normal noncontrast appearance of the left kidney.  Normal hepatic contour. Normal noncontrast appearance of the gallbladder. No radiopaque gallstones. No ascites.  Note is made of an approximately 1.9 cm nodule within the crux of the right adrenal gland which measures approximately 23 Hounsfield units and [place] indeterminate on this noncontrast examination (image 29, series 2). Normal noncontrast appearance of the left adrenal gland, pancreas and spleen.  Moderate colonic stool burden without evidence of enteric obstruction. The bowel is normal in course and caliber without wall thickening or evidence of obstruction. Normal noncontrast appearance of the retrocecal appendix. No pneumoperitoneum, pneumatosis or portal venous gas.  Scattered very minimal amount of atherosclerotic plaque within a normal caliber abdominal aorta.  Scattered shotty retroperitoneal lymph nodes are numerous though individually not enlarged by size criteria with index pre aortic lymph node measuring 0.5 cm  in greatest short axis diameter. No definitive bulky retroperitoneal, mesenteric, pelvic or inguinal lymphadenopathy on this noncontrast examination.  There is a small amount of air seen within the urinary bladder (image 80, series 3), potentially the sequela of recent in and out Foley catheterization. Otherwise, normal noncontrast appearance of the urinary bladder. Post hysterectomy. No discrete adnexal lesions. No free fluid in the pelvic cul-de-sac.  Limited visualization the lower thorax is negative for focal airspace opacity or pleural effusion.  Normal heart size.  No pericardial effusion.  No acute or aggressive osseous abnormalities. Stigmata of DISH within the imaged caudal aspect of the thoracic spine.  Regional soft tissues appear normal.  IMPRESSION: 1. Ill-defined masslike stranding involving the posterior inferior aspect the right kidney effacing the adjacent right iliopsoas musculature - while incompletely characterized on this noncontrast examination, given history of recent diagnosis of pyelonephritis, this is favored to represent a developing renal/perinephric abscess but may be further evaluated with contrast-enhanced abdominal CT as clinically indicated. 2. Very mild pelviectasis involving the superior pole of the right kidney, likely secondary to mass effect from the adjacent infectious/inflammatory process. No renal stones. 3. Incidentally noted approximate nodule within the crux of the right adrenal gland, likely representative of a benign adrenal adenoma though incompletely  characterized the present examination. This could be further evaluated with nonemergent MRI as clinically indicated. Critical Value/emergent results were called by telephone at the time of interpretation on 02/14/2015 at 1:16 pm to Dr. Azalia Bilis , who verbally acknowledged these results.   Electronically Signed   By: Simonne Come M.D.   On: 02/14/2015 13:19   Ct Abdomen Pelvis W Contrast  02/14/2015   CLINICAL DATA:   Possible perinephric abscess detected on non contrasted CT scan to further evaluate  EXAM: CT ABDOMEN AND PELVIS WITH CONTRAST  TECHNIQUE: Multidetector CT imaging of the abdomen and pelvis was performed using the standard protocol following bolus administration of intravenous contrast.  CONTRAST:  OMNIPAQUE IOHEXOL 300 MG/ML  SOLN  COMPARISON:  02/14/2015  FINDINGS: Lower chest:  No acute findings.  Hepatobiliary: Mild steatosis  Pancreas: No mass, inflammatory changes, or other significant abnormality.  Spleen: Within normal limits in size and appearance.  Adrenals/Urinary Tract: 19 mm right adrenal nodule shows postcontrast attenuation value of 54. 35 x 63 x 62 mm perinephric soft tissue abnormality on the right, inseparable from and causing mass effect upon the posterior cortex lower pole right kidney, also inseparable from right paraspinous soft tissues and right psoas musculature. Average attenuation value is 35 with numerous mildly enhancing internal septations. Surrounding inflammatory change noted as previously described. No hydronephrosis. Left kidney and adrenal gland normal. Trace air again noted in the bladder as previously described.  Stomach/Bowel: No evidence of obstruction, inflammatory process, or abnormal fluid collections.  Vascular/Lymphatic: As previously described numerous nonpathologic retroperitoneal lymph nodes noted  Reproductive: No mass or other significant abnormality.  Other: None.  Musculoskeletal:  No suspicious bone lesions identified.  IMPRESSION: 1. Postcontrast images suggesting right perinephric abscess with extrinsic mass effect on the kidney although there is no hydronephrosis appreciated. Would suggest appropriate treatment with follow-up imaging to ensure resolution. 2. Indeterminate right adrenal nodule. As previously recommended, nonemergent MRI suggested.   Electronically Signed   By: Esperanza Heir M.D.   On: 02/14/2015 14:17      No results found for:  HGBA1C Lab Results  Component Value Date   CREATININE 0.56 02/15/2015       Scheduled Meds: . insulin aspart  0-9 Units Subcutaneous TID WC  . insulin glargine  20 Units Subcutaneous Daily  . piperacillin-tazobactam (ZOSYN)  IV  3.375 g Intravenous 3 times per day   Continuous Infusions: . dextrose 5 % and 0.45% NaCl 125 mL/hr at 02/15/15 0537  . insulin (NOVOLIN-R) infusion    . 0.9 % sodium chloride with kcl      Principal Problem:   Perinephric abscess Active Problems:   Poorly controlled diabetes mellitus (HCC)   DKA (diabetic ketoacidoses) (HCC)   Hyponatremia   Essential hypertension    Time spent: 45 minutes   [facility]  [facility] Pager [phone]. If 7PM-7AM, please contact night-coverage at [email or web address], password [email or web address] 02/15/2015, 9:56 AM  LOS: 1 day

## 2015-02-15 NOTE — H&P (Signed)
Chief Complaint: Patient was seen in consultation today for right perinephric/psoas fluid collection Chief Complaint  Patient presents with  . Abdominal Pain  . Back Pain   at the request of [facility]  Referring Physician(s): [facility] Dr. Susie Cassette  History of Present Illness: Mandy Garza is a 44 y.o. female with complaints of right flank pain and fevers x 3 weeks and 1 week prior to that has also been feeling fatigue. She also c/o nausea with vomiting- last emesis episode yesterday. She was seen at health clinic and started on Bactrim 1 week ago without relief and presented to ED 10/10 CT imaging revealed right perinephric/psoas fluid collection concern for abscess. She was started on IV zosyn and states her nausea has slightly improved. She rates her current right flank pain 6/10. IR received request for image guided drain placement. She denies any chest pain, shortness of breath or palpitations. She denies any active signs of bleeding or excessive bruising. The patient denies any history of sleep apnea or chronic oxygen use. She has no known complications to sedation.    Past Medical History  Diagnosis Date  . Obesity   . Hypertension   . Diabetes mellitus without complication [facility])     Past Surgical History  Procedure Laterality Date  . Abdominal hysterectomy      Allergies: Review of patient's allergies indicates no known allergies.  Medications: Prior to Admission medications   Medication Sig Start Date End Date Taking? Authorizing Provider  diclofenac (VOLTAREN) 75 MG EC tablet Take 75 mg by mouth 2 (two) times daily.   Yes Historical Provider, MD  glimepiride (AMARYL) 2 MG tablet Take 2 mg by mouth daily with breakfast.   Yes Historical Provider, MD  lisinopril-hydrochlorothiazide (PRINZIDE,ZESTORETIC) 10-12.5 MG tablet Take 1 tablet by mouth daily.   Yes Historical Provider, MD  metFORMIN (GLUCOPHAGE) 1000 MG tablet Take 1,000 mg by mouth 2 (two) times daily with a meal.    Yes Historical Provider, MD     Family History  Problem Relation Age of Onset  . Diabetes Father   . Hypertension Father     Social History   Social History  . Marital Status: Divorced    Spouse Name: N/A  . Number of Children: N/A  . Years of Education: N/A   Social History Main Topics  . Smoking status: Never Smoker   . Smokeless tobacco: None  . Alcohol Use: No  . Drug Use: No  . Sexual Activity: Yes    Birth Control/ Protection: None   Other Topics Concern  . None   Social History Narrative   Review of Systems: A 12 point ROS discussed and pertinent positives are indicated in the HPI above.  All other systems are negative.  Review of Systems  Vital Signs: BP 99/59 mmHg  Pulse 94  Temp(Src) 98.8 F (37.1 C) (Oral)  Resp 20  Ht  (1.422 m)  Wt 219 lb 9.3 oz (99.6 kg)  BMI 49.26 kg/m2  SpO2 97%  Physical Exam  Constitutional: She is oriented to person, place, and time. No distress.  HENT:  Head: Normocephalic and atraumatic.  Cardiovascular: Normal rate and regular rhythm.  Exam reveals no gallop and no friction rub.   No murmur heard. Pulmonary/Chest: Effort normal and breath sounds normal. No respiratory distress. She has no wheezes. She has no rales.  Abdominal: Soft. Bowel sounds are normal. There is tenderness.  Neurological: She is alert and oriented to person, place, and time.  Skin: She is not diaphoretic.    Mallampati Score:  MD Evaluation Airway: WNL Heart: WNL Abdomen: WNL Chest/ Lungs: WNL ASA  Classification: 2 Mallampati/Airway Score: Two  Imaging: Ct Abdomen Pelvis Wo Contrast  02/14/2015   CLINICAL DATA:  Right-sided flank pain radiating to the right abdomen. Recent diagnosis of pyelonephritis.  EXAM: CT ABDOMEN AND PELVIS WITHOUT CONTRAST  TECHNIQUE: Multidetector CT imaging of the abdomen and pelvis was performed following the standard protocol without IV contrast.  COMPARISON:  None.  FINDINGS: The lack of intravenous  contrast limits the ability to evaluate solid abdominal organs.  There is ill-defined masslike stranding about the posterior aspect of the inferior pole the right kidney with dominant component measuring at least 3.5 x 6.3 x 6.2 cm (as measured in greatest oblique axial - image 47, series 2 ; and coronal - image 65, series 4, dimensions respectively. This ill-defined stranding effaces the adjacent right iliopsoas musculature as well as the posterior inferior aspect of the right kidney. There is minimal pelviectasis involving the superior pole of the right kidney. No renal stones. No discrete areas of osteolysis involving the adjacent vertebral bodies. Adjacent vertebral body heights appear preserved.  Normal noncontrast appearance of the left kidney.  Normal hepatic contour. Normal noncontrast appearance of the gallbladder. No radiopaque gallstones. No ascites.  Note is made of an approximately 1.9 cm nodule within the crux of the right adrenal gland which measures approximately 23 Hounsfield units and [place] indeterminate on this noncontrast examination (image 29, series 2). Normal noncontrast appearance of the left adrenal gland, pancreas and spleen.  Moderate colonic stool burden without evidence of enteric obstruction. The bowel is normal in course and caliber without wall thickening or evidence of obstruction. Normal noncontrast appearance of the retrocecal appendix. No pneumoperitoneum, pneumatosis or portal venous gas.  Scattered very minimal amount of atherosclerotic plaque within a normal caliber abdominal aorta.  Scattered shotty retroperitoneal lymph nodes are numerous though individually not enlarged by size criteria with index pre aortic lymph node measuring 0.5 cm in greatest short axis diameter. No definitive bulky retroperitoneal, mesenteric, pelvic or inguinal lymphadenopathy on this noncontrast examination.  There is a small amount of air seen within the urinary bladder (image 80, series 3),  potentially the sequela of recent in and out Foley catheterization. Otherwise, normal noncontrast appearance of the urinary bladder. Post hysterectomy. No discrete adnexal lesions. No free fluid in the pelvic cul-de-sac.  Limited visualization the lower thorax is negative for focal airspace opacity or pleural effusion.  Normal heart size.  No pericardial effusion.  No acute or aggressive osseous abnormalities. Stigmata of DISH within the imaged caudal aspect of the thoracic spine.  Regional soft tissues appear normal.  IMPRESSION: 1. Ill-defined masslike stranding involving the posterior inferior aspect the right kidney effacing the adjacent right iliopsoas musculature - while incompletely characterized on this noncontrast examination, given history of recent diagnosis of pyelonephritis, this is favored to represent a developing renal/perinephric abscess but may be further evaluated with contrast-enhanced abdominal CT as clinically indicated. 2. Very mild pelviectasis involving the superior pole of the right kidney, likely secondary to mass effect from the adjacent infectious/inflammatory process. No renal stones. 3. Incidentally noted approximate nodule within the crux of the right adrenal gland, likely representative of a benign adrenal adenoma though incompletely characterized the present examination. This could be further evaluated with nonemergent MRI as clinically indicated. Critical Value/emergent results were called by telephone at the time of interpretation on 02/14/2015 at 1:16 pm to  Dr. Azalia Bilis , who verbally acknowledged these results.   Electronically Signed   By: Simonne Come M.D.   On: 02/14/2015 13:19   Ct Abdomen Pelvis W Contrast  02/14/2015   CLINICAL DATA:  Possible perinephric abscess detected on non contrasted CT scan to further evaluate  EXAM: CT ABDOMEN AND PELVIS WITH CONTRAST  TECHNIQUE: Multidetector CT imaging of the abdomen and pelvis was performed using the standard protocol  following bolus administration of intravenous contrast.  CONTRAST:  OMNIPAQUE IOHEXOL 300 MG/ML  SOLN  COMPARISON:  02/14/2015  FINDINGS: Lower chest:  No acute findings.  Hepatobiliary: Mild steatosis  Pancreas: No mass, inflammatory changes, or other significant abnormality.  Spleen: Within normal limits in size and appearance.  Adrenals/Urinary Tract: 19 mm right adrenal nodule shows postcontrast attenuation value of 54. 35 x 63 x 62 mm perinephric soft tissue abnormality on the right, inseparable from and causing mass effect upon the posterior cortex lower pole right kidney, also inseparable from right paraspinous soft tissues and right psoas musculature. Average attenuation value is 35 with numerous mildly enhancing internal septations. Surrounding inflammatory change noted as previously described. No hydronephrosis. Left kidney and adrenal gland normal. Trace air again noted in the bladder as previously described.  Stomach/Bowel: No evidence of obstruction, inflammatory process, or abnormal fluid collections.  Vascular/Lymphatic: As previously described numerous nonpathologic retroperitoneal lymph nodes noted  Reproductive: No mass or other significant abnormality.  Other: None.  Musculoskeletal:  No suspicious bone lesions identified.  IMPRESSION: 1. Postcontrast images suggesting right perinephric abscess with extrinsic mass effect on the kidney although there is no hydronephrosis appreciated. Would suggest appropriate treatment with follow-up imaging to ensure resolution. 2. Indeterminate right adrenal nodule. As previously recommended, nonemergent MRI suggested.   Electronically Signed   By: Esperanza Heir M.D.   On: 02/14/2015 14:17    Labs:  CBC:  Recent Labs  02/14/15 1100 02/15/15 0431  WBC 12.9* 10.3  HGB 13.8 12.6  HCT 41.2 36.9  PLT 342 297    COAGS:  Recent Labs  02/14/15 1641  INR 1.35    BMP:  Recent Labs  02/14/15 2025 02/15/15 0013 02/15/15 0431  02/15/15 0725  NA 131* 132* 133* 133*  K 3.2* 2.9* 2.8* 3.3*  CL 97* 98* 100* 99*  CO2 21* 22 20* 20*  GLUCOSE 259* 154* 149* 137*  BUN 5* <5* <5* 6  CALCIUM 8.2* 8.4* 8.4* 8.4*  CREATININE 0.64 0.55 0.56 0.57  GFRNONAA >60 >60 >60 >60  GFRAA >60 >60 >60 >60    LIVER FUNCTION TESTS:  Recent Labs  02/14/15 1100  BILITOT 1.4*  AST 19  ALT 13*  ALKPHOS 166*  PROT 8.8*  ALBUMIN 2.5*    Assessment and Plan: Right flank pain and fevers x 3 weeks Fatigue Nausea with vomiting- last episode yesterday Seen at health clinic and started on Bactrim 1 week ago without relief CT 10/10 IN ED Right perinephric/psoas fluid collection- on IV zosyn  Request for image guided drain placement with sedation  The patient has been NPO, no blood thinners taken, labs and vitals have been reviewed. Risks and Benefits discussed with the patient including bleeding, infection, damage to adjacent structures, bowel perforation/fistula connection, and sepsis. All of the patient's questions were answered, patient is agreeable to proceed. Consent signed and in chart. DM HTN   Thank you for this interesting consult.  I greatly enjoyed meeting Mandy Garza and look forward to participating in their care.  A copy  of this report was sent to the requesting provider on this date.  SignedBerneta Levins 02/15/2015, 11:24 AM   I spent a total of 20 Minutes in face to face in clinical consultation, greater than 50% of which was counseling/coordinating care for right perinephric/psoas fluid collection.

## 2015-02-15 NOTE — Progress Notes (Signed)
MEDICATION RELATED CONSULT NOTE - INITIAL   Pharmacy Consult for restart of SQ heparin post IR procedure.  Indication: VTE prophylaxis  No Known Allergies  Patient Measurements: Height:  (142.2 cm) Weight: 219 lb 9.3 oz (99.6 kg) IBW/kg (Calculated) : 36.3   Vital Signs: Temp: 99.6 F (37.6 C) (10/11 1307) Temp Source: Oral (10/11 1307) BP: 88/50 mmHg (10/11 1345) Pulse Rate: 87 (10/11 1345) Intake/Output from previous day: 10/10 0701 - 10/11 0700 In: 1766.7 [I.V.:1666.7; IV Piggyback:100] Out: 0  Intake/Output from this shift:    Labs:  Recent Labs  02/14/15 1100  02/14/15 1642  02/15/15 0431 02/15/15 0725 02/15/15 1110  WBC 12.9*  --   --   --  10.3  --   --   HGB 13.8  --   --   --  12.6  --   --   HCT 41.2  --   --   --  36.9  --   --   PLT 342  --   --   --  297  --   --   CREATININE 0.92  < >  --   < > 0.56 0.57 0.58  MG  --   --  1.5*  --   --   --  1.6*  PHOS  --   --  1.9*  --   --   --   --   ALBUMIN 2.5*  --   --   --   --   --  2.0*  PROT 8.8*  --   --   --   --   --  7.3  AST 19  --   --   --   --   --  19  ALT 13*  --   --   --   --   --  12*  ALKPHOS 166*  --   --   --   --   --  153*  BILITOT 1.4*  --   --   --   --   --  0.5  < > = values in this interval not displayed. Estimated Creatinine Clearance: 88.2 mL/min (by C-G formula based on Cr of 0.58).  Medical History: Past Medical History  Diagnosis Date  . Obesity   . Hypertension   . Diabetes mellitus without complication (HCC)     Assessment: S/p CT image guided drainage percutaneous cath for renal and perinephric abscess.  I spoke to Dr. Fredia Sorrow via phone. He said it is okay to restart the SQ heparin at anytime.   Plan:  Okay to restart SQ heparin 5000 units q8hr- start at approximately 18:30 tonight,  No dose at 22:00. Next dose due 6am tomorrow and continue q8h for VTE prophylaxis. Pharmacy will sign off and discontinue the heparin consult for VTE prophylaxis that was  ordered.   Noah Delaine, RPh Clinical Pharmacist Pager: [phone] 02/15/2015,5:03 PM

## 2015-02-16 LAB — HEMOGLOBIN A1C
HEMOGLOBIN A1C: 16.5 % — AB (ref 4.8–5.6)
Hgb A1c MFr Bld: 16.6 % — ABNORMAL HIGH (ref 4.8–5.6)
MEAN PLASMA GLUCOSE: 427 mg/dL
MEAN PLASMA GLUCOSE: 430 mg/dL

## 2015-02-16 LAB — COMPREHENSIVE METABOLIC PANEL
ALBUMIN: 1.8 g/dL — AB (ref 3.5–5.0)
ALT: 13 U/L — ABNORMAL LOW (ref 14–54)
AST: 21 U/L (ref 15–41)
Alkaline Phosphatase: 159 U/L — ABNORMAL HIGH (ref 38–126)
Anion gap: 9 (ref 5–15)
BUN: 6 mg/dL (ref 6–20)
CALCIUM: 8 mg/dL — AB (ref 8.9–10.3)
CHLORIDE: 100 mmol/L — AB (ref 101–111)
CO2: 23 mmol/L (ref 22–32)
CREATININE: 0.65 mg/dL (ref 0.44–1.00)
GFR calc Af Amer: >60 mL/min (ref >60)
GFR calc non Af Amer: >60 mL/min (ref >60)
GLUCOSE: 350 mg/dL — AB (ref 65–99)
Potassium: 4.2 mmol/L (ref 3.5–5.1)
SODIUM: 132 mmol/L — AB (ref 135–145)
Total Bilirubin: 0.6 mg/dL (ref 0.3–1.2)
Total Protein: 6.7 g/dL (ref 6.5–8.1)

## 2015-02-16 LAB — CBC
HCT: 34.9 % — ABNORMAL LOW (ref 36.0–46.0)
Hemoglobin: 11.6 g/dL — ABNORMAL LOW (ref 12.0–15.0)
MCH: 29.9 pg (ref 26.0–34.0)
MCHC: 33.2 g/dL (ref 30.0–36.0)
MCV: 89.9 fL (ref 78.0–100.0)
Platelets: 259 10*3/uL (ref 150–400)
RBC: 3.88 MIL/uL (ref 3.87–5.11)
RDW: 12.6 % (ref 11.5–15.5)
WBC: 6 10*3/uL (ref 4.0–10.5)

## 2015-02-16 LAB — GLUCOSE, CAPILLARY
GLUCOSE-CAPILLARY: 319 mg/dL — AB (ref 65–99)
GLUCOSE-CAPILLARY: 342 mg/dL — AB (ref 65–99)
GLUCOSE-CAPILLARY: 308 mg/dL — AB (ref 65–99)
Glucose-Capillary: 295 mg/dL — ABNORMAL HIGH (ref 65–99)

## 2015-02-16 MED ORDER — INSULIN GLARGINE 100 UNIT/ML ~~LOC~~ SOLN
10.0000 [IU] | Freq: Once | SUBCUTANEOUS | Status: AC
  Administered 2015-02-16: 10 [IU] via SUBCUTANEOUS
  Filled 2015-02-16: qty 0.1

## 2015-02-16 MED ORDER — INSULIN ASPART 100 UNIT/ML ~~LOC~~ SOLN
0.0000 [IU] | SUBCUTANEOUS | Status: DC
  Administered 2015-02-16: 5 [IU] via SUBCUTANEOUS
  Administered 2015-02-17 (×2): 7 [IU] via SUBCUTANEOUS
  Administered 2015-02-17: 5 [IU] via SUBCUTANEOUS
  Administered 2015-02-17: 3 [IU] via SUBCUTANEOUS

## 2015-02-16 MED ORDER — VANCOMYCIN HCL IN DEXTROSE 1-5 GM/200ML-% IV SOLN
1000.0000 mg | Freq: Two times a day (BID) | INTRAVENOUS | Status: DC
  Administered 2015-02-16 – 2015-02-17 (×2): 1000 mg via INTRAVENOUS
  Filled 2015-02-16 (×3): qty 200

## 2015-02-16 MED ORDER — VANCOMYCIN HCL 10 G IV SOLR
1500.0000 mg | Freq: Once | INTRAVENOUS | Status: AC
  Administered 2015-02-16: 1500 mg via INTRAVENOUS
  Filled 2015-02-16 (×2): qty 1500

## 2015-02-16 MED ORDER — INSULIN GLARGINE 100 UNIT/ML ~~LOC~~ SOLN
40.0000 [IU] | Freq: Every day | SUBCUTANEOUS | Status: DC
  Administered 2015-02-16: 40 [IU] via SUBCUTANEOUS
  Filled 2015-02-16 (×2): qty 0.4

## 2015-02-16 MED ORDER — SODIUM CHLORIDE 0.9 % IV SOLN
INTRAVENOUS | Status: DC
Start: 2015-02-16 — End: 2015-02-17
  Administered 2015-02-16 – 2015-02-17 (×2): via INTRAVENOUS

## 2015-02-16 NOTE — Progress Notes (Signed)
Utilization review completed. Dinara Lupu, RN, BSN. 

## 2015-02-16 NOTE — Progress Notes (Signed)
Inpatient Diabetes Program Recommendations  AACE/ADA: New Consensus Statement on Inpatient Glycemic Control (2015)  Target Ranges:  Prepandial:   less than 140 mg/dL      Peak postprandial:   less than 180 mg/dL (1-2 hours)      Critically ill patients:  140 - 180 mg/dL   Review of Glycemic Control  Noted increase in basal insulin to 40 units started this am. Given 20 units yesterday for total of 60 units over the past 24 hrs. Patient also got an additional 10 units lantus this afternoon. May want to increase correction scale rather than adding basal insulin. Basal insulin typically takes 24 hrs to make improvement in glucose control-typically see in the am fasting cbg.   Would recommend an increase in correction scale to moderate or resistant if cbg's continue elevated. Once fasting is normalized, correction scale and/or meal coverage may help cbg's prior to lunch and supper.  Thank you Lenor CoffinAnn Duff Pozzi, RN, MSN, CDE  Diabetes Inpatient Program Office: [phone] Pager: [phone] 8:00 am to 5:00 pm

## 2015-02-16 NOTE — Progress Notes (Signed)
Referring Physician(s): TRH  Chief Complaint: Right perinephric/psoas abscess S/p perc drain 10/11  Subjective: Patient states her right flank pain has improved since drain and overall her energy level has also improved.   Allergies: Review of patient's allergies indicates no known allergies.  Medications: Prior to Admission medications   Medication Sig Start Date End Date Taking? Authorizing Provider  diclofenac (VOLTAREN) 75 MG EC tablet Take 75 mg by mouth 2 (two) times daily.   Yes Historical Provider, MD  glimepiride (AMARYL) 2 MG tablet Take 2 mg by mouth daily with breakfast.   Yes Historical Provider, MD  lisinopril-hydrochlorothiazide (PRINZIDE,ZESTORETIC) 10-12.5 MG tablet Take 1 tablet by mouth daily.   Yes Historical Provider, MD  metFORMIN (GLUCOPHAGE) 1000 MG tablet Take 1,000 mg by mouth 2 (two) times daily with a meal.   Yes Historical Provider, MD    Vital Signs: BP 106/70 mmHg  Pulse 73  Temp(Src) 97.5 F (36.4 C) (Oral)  Resp 18  Ht  (1.422 m)  Wt 223 lb 5.2 oz (101.3 kg)  BMI 50.10 kg/m2  SpO2 100%  Physical Exam General: A&Ox3, NAD Abd: Soft, minimal tenderness at right flank drain site, output tan purulent 25 cc in bulb, 95 cc/24 hrs  Imaging: Ct Abdomen Pelvis Wo Contrast  02/14/2015  CLINICAL DATA:  Right-sided flank pain radiating to the right abdomen. Recent diagnosis of pyelonephritis. EXAM: CT ABDOMEN AND PELVIS WITHOUT CONTRAST TECHNIQUE: Multidetector CT imaging of the abdomen and pelvis was performed following the standard protocol without IV contrast. COMPARISON:  None. FINDINGS: The lack of intravenous contrast limits the ability to evaluate solid abdominal organs. There is ill-defined masslike stranding about the posterior aspect of the inferior pole the right kidney with dominant component measuring at least 3.5 x 6.3 x 6.2 cm (as measured in greatest oblique axial - image 47, series 2 ; and coronal - image 65, series 4, dimensions  respectively. This ill-defined stranding effaces the adjacent right iliopsoas musculature as well as the posterior inferior aspect of the right kidney. There is minimal pelviectasis involving the superior pole of the right kidney. No renal stones. No discrete areas of osteolysis involving the adjacent vertebral bodies. Adjacent vertebral body heights appear preserved. Normal noncontrast appearance of the left kidney. Normal hepatic contour. Normal noncontrast appearance of the gallbladder. No radiopaque gallstones. No ascites. Note is made of an approximately 1.9 cm nodule within the crux of the right adrenal gland which measures approximately 23 Hounsfield units and [place] indeterminate on this noncontrast examination (image 29, series 2). Normal noncontrast appearance of the left adrenal gland, pancreas and spleen. Moderate colonic stool burden without evidence of enteric obstruction. The bowel is normal in course and caliber without wall thickening or evidence of obstruction. Normal noncontrast appearance of the retrocecal appendix. No pneumoperitoneum, pneumatosis or portal venous gas. Scattered very minimal amount of atherosclerotic plaque within a normal caliber abdominal aorta. Scattered shotty retroperitoneal lymph nodes are numerous though individually not enlarged by size criteria with index pre aortic lymph node measuring 0.5 cm in greatest short axis diameter. No definitive bulky retroperitoneal, mesenteric, pelvic or inguinal lymphadenopathy on this noncontrast examination. There is a small amount of air seen within the urinary bladder (image 80, series 3), potentially the sequela of recent in and out Foley catheterization. Otherwise, normal noncontrast appearance of the urinary bladder. Post hysterectomy. No discrete adnexal lesions. No free fluid in the pelvic cul-de-sac. Limited visualization the lower thorax is negative for focal airspace opacity or pleural  effusion. Normal heart size.  No pericardial  effusion. No acute or aggressive osseous abnormalities. Stigmata of DISH within the imaged caudal aspect of the thoracic spine. Regional soft tissues appear normal. IMPRESSION: 1. Ill-defined masslike stranding involving the posterior inferior aspect the right kidney effacing the adjacent right iliopsoas musculature - while incompletely characterized on this noncontrast examination, given history of recent diagnosis of pyelonephritis, this is favored to represent a developing renal/perinephric abscess but may be further evaluated with contrast-enhanced abdominal CT as clinically indicated. 2. Very mild pelviectasis involving the superior pole of the right kidney, likely secondary to mass effect from the adjacent infectious/inflammatory process. No renal stones. 3. Incidentally noted approximate nodule within the crux of the right adrenal gland, likely representative of a benign adrenal adenoma though incompletely characterized the present examination. This could be further evaluated with nonemergent MRI as clinically indicated. Critical Value/emergent results were called by telephone at the time of interpretation on 02/14/2015 at 1:16 pm to Dr. Azalia BilisKEVIN CAMPOS , who verbally acknowledged these results. Electronically Signed   By: Simonne ComeJohn  Watts M.D.   On: 02/14/2015 13:19   Ct Abdomen Pelvis W Contrast  02/14/2015  CLINICAL DATA:  Possible perinephric abscess detected on non contrasted CT scan to further evaluate EXAM: CT ABDOMEN AND PELVIS WITH CONTRAST TECHNIQUE: Multidetector CT imaging of the abdomen and pelvis was performed using the standard protocol following bolus administration of intravenous contrast. CONTRAST:  100mL OMNIPAQUE IOHEXOL 300 MG/ML  SOLN COMPARISON:  02/14/2015 FINDINGS: Lower chest:  No acute findings. Hepatobiliary: Mild steatosis Pancreas: No mass, inflammatory changes, or other significant abnormality. Spleen: Within normal limits in size and appearance. Adrenals/Urinary Tract: 19 mm right  adrenal nodule shows postcontrast attenuation value of 54. 35 x 63 x 62 mm perinephric soft tissue abnormality on the right, inseparable from and causing mass effect upon the posterior cortex lower pole right kidney, also inseparable from right paraspinous soft tissues and right psoas musculature. Average attenuation value is 35 with numerous mildly enhancing internal septations. Surrounding inflammatory change noted as previously described. No hydronephrosis. Left kidney and adrenal gland normal. Trace air again noted in the bladder as previously described. Stomach/Bowel: No evidence of obstruction, inflammatory process, or abnormal fluid collections. Vascular/Lymphatic: As previously described numerous nonpathologic retroperitoneal lymph nodes noted Reproductive: No mass or other significant abnormality. Other: None. Musculoskeletal:  No suspicious bone lesions identified. IMPRESSION: 1. Postcontrast images suggesting right perinephric abscess with extrinsic mass effect on the kidney although there is no hydronephrosis appreciated. Would suggest appropriate treatment with follow-up imaging to ensure resolution. 2. Indeterminate right adrenal nodule. As previously recommended, nonemergent MRI suggested. Electronically Signed   By: Esperanza Heiraymond  Rubner M.D.   On: 02/14/2015 14:17   Ct Image Guided Drainage By Percutaneous Catheter  02/15/2015  CLINICAL DATA:  Right-sided retroperitoneal abscess located posterior to the lower pole of the right kidney and also involving the adjacent iliopsoas muscle. The patient presents for drainage. EXAM: CT GUIDED PERCUTANEOUS CATHETER DRAINAGE OF RIGHT RETROPERITONEAL ABSCESS ANESTHESIA/SEDATION: 1.0 Mg IV Versed 50 mcg IV Fentanyl Total Moderate Sedation Time:  15 minutes. PROCEDURE: The procedure, risks, benefits, and alternatives were explained to the patient. Questions regarding the procedure were encouraged and answered. The patient understands and consents to the procedure. A  time-out was performed prior to the procedure. The right flank region was prepped with Betadine in a sterile fashion, and a sterile drape was applied covering the operative field. A sterile gown and sterile gloves were used for the procedure. Local  anesthesia was provided with 1% Lidocaine. CT was performed in a prone position. Under CT guidance, an 18 gauge trocar needle was advanced into the retroperitoneal space. Aspiration was performed at the level of the right retroperitoneal abscess and a fluid sample sent for culture analysis. A guidewire was advanced into the collection. A 12 French percutaneous drain was then advanced over the wire. Drain positioning was confirmed by CT. The catheter was flushed with saline and attached to a suction bulb. The catheter was secured at the skin with a Prolene retention suture and adhesive StatLock device. COMPLICATIONS: None FINDINGS: Aspiration at the level of the complex right posterior retroperitoneal abscess yielded very thick, purulent fluid. A drainage catheter was able to be formed in the complex collection. Output will be followed. IMPRESSION: CT-guided drainage of right retroperitoneal abscess yielding purulent fluid. A 12 French drain was placed and attached to suction bulb drainage. Electronically Signed   By: Irish Lack M.D.   On: 02/15/2015 13:22    Labs:  CBC:  Recent Labs  02/14/15 1100 02/15/15 0431 02/16/15 0528  WBC 12.9* 10.3 6.0  HGB 13.8 12.6 11.6*  HCT 41.2 36.9 34.9*  PLT 342 297 259    COAGS:  Recent Labs  02/14/15 1641  INR 1.35    BMP:  Recent Labs  02/15/15 0431 02/15/15 0725 02/15/15 1110 02/16/15 0528  NA 133* 133* 133* 132*  K 2.8* 3.3* 2.7* 4.2  CL 100* 99* 98* 100*  CO2 20* 20* 25 23  GLUCOSE 149* 137* 140* 350*  BUN <5* 6 <5* 6  CALCIUM 8.4* 8.4* 8.3* 8.0*  CREATININE 0.56 0.57 0.58 0.65  GFRNONAA >60 >60 >60 >60  GFRAA >60 >60 >60 >60    LIVER FUNCTION TESTS:  Recent Labs  02/14/15 1100  02/15/15 1110 02/16/15 0528  BILITOT 1.4* 0.5 0.6  AST ALT 13* 12* 13*  ALKPHOS 166* 153* 159*  PROT 8.8* 7.3 6.7  ALBUMIN 2.5* 2.0* 1.8*    Assessment and Plan: Right perinephric/psoas abscess S/p perc drain 10/11, wbc trending down wnl, afebrile, output purulent, H/H stable, Cx pending/gram (+) cocci in clusters Would recommend ID consult prior to discharge to help with follow-up Once discharged IR will arrange f/u in drain clinic with repeat imaging Continue to flush and monitor output Plans per [facility]   Signed: Pattricia Boss D 02/16/2015, 10:31 AM   I spent a total of 15 Minutes at the the patient's bedside AND on the patient's hospital floor or unit, greater than 50% of which was counseling/coordinating care for right perinephric/psoas abscess

## 2015-02-16 NOTE — Progress Notes (Addendum)
[facility] PROGRESS NOTE  [name] YQM:[id number] DOB: 06/07/1970 DOA: 02/14/2015 PCP: No primary care provider on file.  Length of stay: 2   Assessment/Plan: Principal Problem:   Perinephric abscess Active Problems:   Poorly controlled diabetes mellitus (HCC)   DKA (diabetic ketoacidoses) (HCC)   Hyponatremia   Essential hypertension    Perinephric abscess-S/p perc drain 10/11, wbc trending down wnl, afebrile, output purulent, H/H stable, Cx pending/gram (+) cocci in clusters, continue IV Zosyn, and add vancomycin, continued follow cultures, continue IV hydration, will consult infectious disease tomorrow pending more definitive culture results  Diabetic ketoacidosis-anion gap closed, hemoglobin A1c pending, patient of glucose stabilizer, increase Lantus to 40 units, continue sliding scale insulin  Hypokalemia-repleted , replete magnesium  Hyponatremia secondary to dehydration, now improving with IV fluids  Hypertension discontinue antihypertensives given soft blood pressure   DVT prophylaxsis , start heparin after procedure   Code Status:      Code Status Orders        Start     Ordered   02/14/15 1637  Full code   Continuous     02/14/15 1637     Family Communication: family updated about patient's clinical progress Disposition Plan:   anticipate discharge in one to 2 days    Brief narrative: 44 y.o. female  R flank and back pain. Started 3wks ago. Pain is constant with waxing and waning nature. Getting worse. Patient was started on Bactrim 4 days ago without symptoms. Patient states she's been compliant with her metformin and, prior but blood glucose is been around 600. Patient has had very little oral intake over the last couple of days due to persistent nausea. Endorses mild dysuria and frequency and subjective fevers  Consultants:  Interventional radiology  Procedures:  None  Antibiotics: Anti-infectives    Start      Dose/Rate Route Frequency Ordered Stop   02/16/15 2300  vancomycin (VANCOCIN) IVPB 1000 mg/200 mL premix     1,000 mg 200 mL/hr over 60 Minutes Intravenous Every 12 hours 02/16/15 0949     02/16/15 1100  vancomycin (VANCOCIN) 1,500 mg in sodium chloride 0.9 % 500 mL IVPB     1,500 mg 250 mL/hr over 120 Minutes Intravenous  Once 02/16/15 0949     02/14/15 2200  piperacillin-tazobactam (ZOSYN) IVPB 3.375 g     3.375 g 12.5 mL/hr over 240 Minutes Intravenous 3 times per day 02/14/15 2146     02/14/15 1330  piperacillin-tazobactam (ZOSYN) IVPB 3.375 g     3.375 g 100 mL/hr over 30 Minutes Intravenous  Once 02/14/15 1320 02/14/15 1504         HPI/Subjective: Right flank pain relieved after drain placement  Objective: Filed Vitals:   02/15/15 1847 02/15/15 1958 02/16/15 0458 02/16/15 0948  BP: 99/61 101/55 90/40 106/70  Pulse: 85 89 79 73  Temp: 99.6 F (37.6 C) 98.4 F (36.9 C) 97.7 F (36.5 C) 97.5 F (36.4 C)  TempSrc: Oral Oral Oral Oral  Resp: 20 18 18 18   Height:      Weight:  101.3 kg (223 lb 5.2 oz)    SpO2: 96% 95% 100% 100%    Intake/Output Summary (Last 24 hours) at 02/16/15 1236 Last data filed at 02/16/15 0900  Gross per 24 hour  Intake    570 ml  Output    795 ml  Net   -225 ml    Exam:  General: No acute respiratory distress Lungs: Clear  to auscultation bilaterally without wheezes or crackles Cardiovascular: Regular rate and rhythm without murmur gallop or rub normal S1 and S2 Abdomen: Nontender, nondistended, soft, bowel sounds positive, no rebound, no ascites, no appreciable mass Extremities: No significant cyanosis, clubbing, or edema bilateral lower extremities     Data Review   Micro Results Recent Results (from the past 240 hour(s))  Urine culture     Status: None   Collection Time: 02/14/15 11:39 AM  Result Value Ref Range Status   Specimen Description URINE, CLEAN CATCH  Final   Special Requests NONE  Final   Culture MULTIPLE SPECIES  PRESENT, SUGGEST RECOLLECTION  Final   Report Status 02/15/2015 FINAL  Final  Culture, blood (routine x 2)     Status: None (Preliminary result)   Collection Time: 02/14/15  8:25 PM  Result Value Ref Range Status   Specimen Description BLOOD RIGHT HAND  Final   Special Requests BOTTLES DRAWN AEROBIC AND ANAEROBIC 7CC  Final   Culture NO GROWTH < 24 HOURS  Final   Report Status PENDING  Incomplete  Culture, blood (routine x 2)     Status: None (Preliminary result)   Collection Time: 02/14/15  8:35 PM  Result Value Ref Range Status   Specimen Description BLOOD RIGHT HAND  Final   Special Requests BOTTLES DRAWN AEROBIC AND ANAEROBIC 5CC  Final   Culture NO GROWTH < 24 HOURS  Final   Report Status PENDING  Incomplete  Culture, routine-abscess     Status: None (Preliminary result)   Collection Time: 02/15/15  3:30 PM  Result Value Ref Range Status   Specimen Description ABSCESS RIGHT ABDOMEN  Final   Special Requests Normal  Final   Gram Stain   Final    FEW WBC PRESENT,BOTH PMN AND MONONUCLEAR NO SQUAMOUS EPITHELIAL CELLS SEEN MODERATE GRAM POSITIVE COCCI IN PAIRS IN CLUSTERS [facility]    Culture PENDING  Incomplete   Report Status PENDING  Incomplete  Anaerobic culture     Status: None (Preliminary result)   Collection Time: 02/15/15  3:30 PM  Result Value Ref Range Status   Specimen Description ABSCESS RIGHT ABDOMEN  Final   Special Requests Normal  Final   Gram Stain   Final    FEW WBC PRESENT,BOTH PMN AND MONONUCLEAR NO SQUAMOUS EPITHELIAL CELLS SEEN MODERATE GRAM POSITIVE COCCI IN PAIRS IN CLUSTERS Performed at [facility]    Culture PENDING  Incomplete   Report Status PENDING  Incomplete    Radiology Reports Ct Abdomen Pelvis Wo Contrast  02/14/2015  CLINICAL DATA:  Right-sided flank pain radiating to the right abdomen. Recent diagnosis of pyelonephritis. EXAM: CT ABDOMEN AND PELVIS WITHOUT CONTRAST TECHNIQUE: Multidetector CT  imaging of the abdomen and pelvis was performed following the standard protocol without IV contrast. COMPARISON:  None. FINDINGS: The lack of intravenous contrast limits the ability to evaluate solid abdominal organs. There is ill-defined masslike stranding about the posterior aspect of the inferior pole the right kidney with dominant component measuring at least 3.5 x 6.3 x 6.2 cm (as measured in greatest oblique axial - image 47, series 2 ; and coronal - image 65, series 4, dimensions respectively. This ill-defined stranding effaces the adjacent right iliopsoas musculature as well as the posterior inferior aspect of the right kidney. There is minimal pelviectasis involving the superior pole of the right kidney. No renal stones. No discrete areas of osteolysis involving the adjacent vertebral bodies. Adjacent vertebral body heights appear preserved.  Normal noncontrast appearance of the left kidney. Normal hepatic contour. Normal noncontrast appearance of the gallbladder. No radiopaque gallstones. No ascites. Note is made of an approximately 1.9 cm nodule within the crux of the right adrenal gland which measures approximately 23 Hounsfield units and [place] indeterminate on this noncontrast examination (image 29, series 2). Normal noncontrast appearance of the left adrenal gland, pancreas and spleen. Moderate colonic stool burden without evidence of enteric obstruction. The bowel is normal in course and caliber without wall thickening or evidence of obstruction. Normal noncontrast appearance of the retrocecal appendix. No pneumoperitoneum, pneumatosis or portal venous gas. Scattered very minimal amount of atherosclerotic plaque within a normal caliber abdominal aorta. Scattered shotty retroperitoneal lymph nodes are numerous though individually not enlarged by size criteria with index pre aortic lymph node measuring 0.5 cm in greatest short axis diameter. No definitive bulky retroperitoneal, mesenteric, pelvic or  inguinal lymphadenopathy on this noncontrast examination. There is a small amount of air seen within the urinary bladder (image 80, series 3), potentially the sequela of recent in and out Foley catheterization. Otherwise, normal noncontrast appearance of the urinary bladder. Post hysterectomy. No discrete adnexal lesions. No free fluid in the pelvic cul-de-sac. Limited visualization the lower thorax is negative for focal airspace opacity or pleural effusion. Normal heart size.  No pericardial effusion. No acute or aggressive osseous abnormalities. Stigmata of DISH within the imaged caudal aspect of the thoracic spine. Regional soft tissues appear normal. IMPRESSION: 1. Ill-defined masslike stranding involving the posterior inferior aspect the right kidney effacing the adjacent right iliopsoas musculature - while incompletely characterized on this noncontrast examination, given history of recent diagnosis of pyelonephritis, this is favored to represent a developing renal/perinephric abscess but may be further evaluated with contrast-enhanced abdominal CT as clinically indicated. 2. Very mild pelviectasis involving the superior pole of the right kidney, likely secondary to mass effect from the adjacent infectious/inflammatory process. No renal stones. 3. Incidentally noted approximate nodule within the crux of the right adrenal gland, likely representative of a benign adrenal adenoma though incompletely characterized the present examination. This could be further evaluated with nonemergent MRI as clinically indicated. Critical Value/emergent results were called by telephone at the time of interpretation on 02/14/2015 at 1:16 pm to Dr. Azalia Bilis , who verbally acknowledged these results. Electronically Signed   By: Simonne Come M.D.   On: 02/14/2015 13:19   Ct Abdomen Pelvis W Contrast  02/14/2015  CLINICAL DATA:  Possible perinephric abscess detected on non contrasted CT scan to further evaluate EXAM: CT ABDOMEN  AND PELVIS WITH CONTRAST TECHNIQUE: Multidetector CT imaging of the abdomen and pelvis was performed using the standard protocol following bolus administration of intravenous contrast. CONTRAST:  OMNIPAQUE IOHEXOL 300 MG/ML  SOLN COMPARISON:  02/14/2015 FINDINGS: Lower chest:  No acute findings. Hepatobiliary: Mild steatosis Pancreas: No mass, inflammatory changes, or other significant abnormality. Spleen: Within normal limits in size and appearance. Adrenals/Urinary Tract: 19 mm right adrenal nodule shows postcontrast attenuation value of 54. 35 x 63 x 62 mm perinephric soft tissue abnormality on the right, inseparable from and causing mass effect upon the posterior cortex lower pole right kidney, also inseparable from right paraspinous soft tissues and right psoas musculature. Average attenuation value is 35 with numerous mildly enhancing internal septations. Surrounding inflammatory change noted as previously described. No hydronephrosis. Left kidney and adrenal gland normal. Trace air again noted in the bladder as previously described. Stomach/Bowel: No evidence of obstruction, inflammatory process, or abnormal fluid collections. Vascular/Lymphatic:  As previously described numerous nonpathologic retroperitoneal lymph nodes noted Reproductive: No mass or other significant abnormality. Other: None. Musculoskeletal:  No suspicious bone lesions identified. IMPRESSION: 1. Postcontrast images suggesting right perinephric abscess with extrinsic mass effect on the kidney although there is no hydronephrosis appreciated. Would suggest appropriate treatment with follow-up imaging to ensure resolution. 2. Indeterminate right adrenal nodule. As previously recommended, nonemergent MRI suggested. Electronically Signed   By: Esperanza Heir M.D.   On: 02/14/2015 14:17   Ct Image Guided Drainage By Percutaneous Catheter  02/15/2015  CLINICAL DATA:  Right-sided retroperitoneal abscess located posterior to the lower pole  of the right kidney and also involving the adjacent iliopsoas muscle. The patient presents for drainage. EXAM: CT GUIDED PERCUTANEOUS CATHETER DRAINAGE OF RIGHT RETROPERITONEAL ABSCESS ANESTHESIA/SEDATION: 1.0 Mg IV Versed 50 mcg IV Fentanyl Total Moderate Sedation Time:  15 minutes. PROCEDURE: The procedure, risks, benefits, and alternatives were explained to the patient. Questions regarding the procedure were encouraged and answered. The patient understands and consents to the procedure. A time-out was performed prior to the procedure. The right flank region was prepped with Betadine in a sterile fashion, and a sterile drape was applied covering the operative field. A sterile gown and sterile gloves were used for the procedure. Local anesthesia was provided with 1% Lidocaine. CT was performed in a prone position. Under CT guidance, an 18 gauge trocar needle was advanced into the retroperitoneal space. Aspiration was performed at the level of the right retroperitoneal abscess and a fluid sample sent for culture analysis. A guidewire was advanced into the collection. A 12 French percutaneous drain was then advanced over the wire. Drain positioning was confirmed by CT. The catheter was flushed with saline and attached to a suction bulb. The catheter was secured at the skin with a Prolene retention suture and adhesive StatLock device. COMPLICATIONS: None FINDINGS: Aspiration at the level of the complex right posterior retroperitoneal abscess yielded very thick, purulent fluid. A drainage catheter was able to be formed in the complex collection. Output will be followed. IMPRESSION: CT-guided drainage of right retroperitoneal abscess yielding purulent fluid. A 12 French drain was placed and attached to suction bulb drainage. Electronically Signed   By: Irish Lack M.D.   On: 02/15/2015 13:22     CBC  Recent Labs Lab 02/14/15 1100 02/15/15 0431 02/16/15 0528  WBC 12.9* 10.3 6.0  HGB 13.8 12.6 11.6*  HCT  41.2 36.9 34.9*  PLT 342 297 259  MCV 90.7 89.1 89.9  MCH 30.4 30.4 29.9  MCHC 33.5 34.1 33.2  RDW 12.4 12.5 12.6    Chemistries   Recent Labs Lab 02/14/15 1100  02/14/15 1642  02/15/15 0013 02/15/15 0431 02/15/15 0725 02/15/15 1110 02/16/15 0528  NA 131*  < >  --   < > 132* 133* 133* 133* 132*  K 4.0  < >  --   < > 2.9* 2.8* 3.3* 2.7* 4.2  CL 93*  < >  --   < > 98* 100* 99* 98* 100*  CO2 17*  < >  --   < > 22 20* 20* 25 23  GLUCOSE 440*  < >  --   < > 154* 149* 137* 140* 350*  BUN 9  < >  --   < > <5* <5* 6 <5* 6  CREATININE 0.92  < >  --   < > 0.55 0.56 0.57 0.58 0.65  CALCIUM 9.4  < >  --   < > 8.4*  8.4* 8.4* 8.3* 8.0*  MG  --   --  1.5*  --   --   --   --  1.6*  --   AST 19  --   --   --   --   --   --  19 21  ALT 13*  --   --   --   --   --   --  12* 13*  ALKPHOS 166*  --   --   --   --   --   --  153* 159*  BILITOT 1.4*  --   --   --   --   --   --  0.5 0.6  < > = values in this interval not displayed. ------------------------------------------------------------------------------------------------------------------ estimated creatinine clearance is 89.2 mL/min (by C-G formula based on Cr of 0.65). ------------------------------------------------------------------------------------------------------------------  Recent Labs  02/14/15 2025  HGBA1C 16.5*   ------------------------------------------------------------------------------------------------------------------ No results for input(s): CHOL, HDL, LDLCALC, TRIG, CHOLHDL, LDLDIRECT in the last 72 hours. ------------------------------------------------------------------------------------------------------------------ No results for input(s): TSH, T4TOTAL, T3FREE, THYROIDAB in the last 72 hours.  Invalid input(s): FREET3 ------------------------------------------------------------------------------------------------------------------ No results for input(s): VITAMINB12, FOLATE, FERRITIN, TIBC, IRON, RETICCTPCT  in the last 72 hours.  Coagulation profile  Recent Labs Lab 02/14/15 1641  INR 1.35    No results for input(s): DDIMER in the last 72 hours.  Cardiac Enzymes No results for input(s): CKMB, TROPONINI, MYOGLOBIN in the last 168 hours.  Invalid input(s): CK ------------------------------------------------------------------------------------------------------------------ Invalid input(s): POCBNP   CBG:  Recent Labs Lab 02/15/15 1305 02/15/15 1646 02/15/15 1956 02/16/15 0731 02/16/15 1152  GLUCAP 135* 321* 291* 319* 342*       Studies: Ct Abdomen Pelvis Wo Contrast  02/14/2015  CLINICAL DATA:  Right-sided flank pain radiating to the right abdomen. Recent diagnosis of pyelonephritis. EXAM: CT ABDOMEN AND PELVIS WITHOUT CONTRAST TECHNIQUE: Multidetector CT imaging of the abdomen and pelvis was performed following the standard protocol without IV contrast. COMPARISON:  None. FINDINGS: The lack of intravenous contrast limits the ability to evaluate solid abdominal organs. There is ill-defined masslike stranding about the posterior aspect of the inferior pole the right kidney with dominant component measuring at least 3.5 x 6.3 x 6.2 cm (as measured in greatest oblique axial - image 47, series 2 ; and coronal - image 65, series 4, dimensions respectively. This ill-defined stranding effaces the adjacent right iliopsoas musculature as well as the posterior inferior aspect of the right kidney. There is minimal pelviectasis involving the superior pole of the right kidney. No renal stones. No discrete areas of osteolysis involving the adjacent vertebral bodies. Adjacent vertebral body heights appear preserved. Normal noncontrast appearance of the left kidney. Normal hepatic contour. Normal noncontrast appearance of the gallbladder. No radiopaque gallstones. No ascites. Note is made of an approximately 1.9 cm nodule within the crux of the right adrenal gland which measures approximately 23  Hounsfield units and [place] indeterminate on this noncontrast examination (image 29, series 2). Normal noncontrast appearance of the left adrenal gland, pancreas and spleen. Moderate colonic stool burden without evidence of enteric obstruction. The bowel is normal in course and caliber without wall thickening or evidence of obstruction. Normal noncontrast appearance of the retrocecal appendix. No pneumoperitoneum, pneumatosis or portal venous gas. Scattered very minimal amount of atherosclerotic plaque within a normal caliber abdominal aorta. Scattered shotty retroperitoneal lymph nodes are numerous though individually not enlarged by size criteria with index pre aortic lymph node measuring 0.5 cm in greatest short axis diameter. No  definitive bulky retroperitoneal, mesenteric, pelvic or inguinal lymphadenopathy on this noncontrast examination. There is a small amount of air seen within the urinary bladder (image 80, series 3), potentially the sequela of recent in and out Foley catheterization. Otherwise, normal noncontrast appearance of the urinary bladder. Post hysterectomy. No discrete adnexal lesions. No free fluid in the pelvic cul-de-sac. Limited visualization the lower thorax is negative for focal airspace opacity or pleural effusion. Normal heart size.  No pericardial effusion. No acute or aggressive osseous abnormalities. Stigmata of DISH within the imaged caudal aspect of the thoracic spine. Regional soft tissues appear normal. IMPRESSION: 1. Ill-defined masslike stranding involving the posterior inferior aspect the right kidney effacing the adjacent right iliopsoas musculature - while incompletely characterized on this noncontrast examination, given history of recent diagnosis of pyelonephritis, this is favored to represent a developing renal/perinephric abscess but may be further evaluated with contrast-enhanced abdominal CT as clinically indicated. 2. Very mild pelviectasis involving the superior pole of  the right kidney, likely secondary to mass effect from the adjacent infectious/inflammatory process. No renal stones. 3. Incidentally noted approximate nodule within the crux of the right adrenal gland, likely representative of a benign adrenal adenoma though incompletely characterized the present examination. This could be further evaluated with nonemergent MRI as clinically indicated. Critical Value/emergent results were called by telephone at the time of interpretation on 02/14/2015 at 1:16 pm to Dr. Azalia Bilis , who verbally acknowledged these results. Electronically Signed   By: Simonne Come M.D.   On: 02/14/2015 13:19   Ct Abdomen Pelvis W Contrast  02/14/2015  CLINICAL DATA:  Possible perinephric abscess detected on non contrasted CT scan to further evaluate EXAM: CT ABDOMEN AND PELVIS WITH CONTRAST TECHNIQUE: Multidetector CT imaging of the abdomen and pelvis was performed using the standard protocol following bolus administration of intravenous contrast. CONTRAST:  OMNIPAQUE IOHEXOL 300 MG/ML  SOLN COMPARISON:  02/14/2015 FINDINGS: Lower chest:  No acute findings. Hepatobiliary: Mild steatosis Pancreas: No mass, inflammatory changes, or other significant abnormality. Spleen: Within normal limits in size and appearance. Adrenals/Urinary Tract: 19 mm right adrenal nodule shows postcontrast attenuation value of 54. 35 x 63 x 62 mm perinephric soft tissue abnormality on the right, inseparable from and causing mass effect upon the posterior cortex lower pole right kidney, also inseparable from right paraspinous soft tissues and right psoas musculature. Average attenuation value is 35 with numerous mildly enhancing internal septations. Surrounding inflammatory change noted as previously described. No hydronephrosis. Left kidney and adrenal gland normal. Trace air again noted in the bladder as previously described. Stomach/Bowel: No evidence of obstruction, inflammatory process, or abnormal fluid  collections. Vascular/Lymphatic: As previously described numerous nonpathologic retroperitoneal lymph nodes noted Reproductive: No mass or other significant abnormality. Other: None. Musculoskeletal:  No suspicious bone lesions identified. IMPRESSION: 1. Postcontrast images suggesting right perinephric abscess with extrinsic mass effect on the kidney although there is no hydronephrosis appreciated. Would suggest appropriate treatment with follow-up imaging to ensure resolution. 2. Indeterminate right adrenal nodule. As previously recommended, nonemergent MRI suggested. Electronically Signed   By: Esperanza Heir M.D.   On: 02/14/2015 14:17   Ct Image Guided Drainage By Percutaneous Catheter  02/15/2015  CLINICAL DATA:  Right-sided retroperitoneal abscess located posterior to the lower pole of the right kidney and also involving the adjacent iliopsoas muscle. The patient presents for drainage. EXAM: CT GUIDED PERCUTANEOUS CATHETER DRAINAGE OF RIGHT RETROPERITONEAL ABSCESS ANESTHESIA/SEDATION: 1.0 Mg IV Versed 50 mcg IV Fentanyl Total Moderate Sedation Time:  15  minutes. PROCEDURE: The procedure, risks, benefits, and alternatives were explained to the patient. Questions regarding the procedure were encouraged and answered. The patient understands and consents to the procedure. A time-out was performed prior to the procedure. The right flank region was prepped with Betadine in a sterile fashion, and a sterile drape was applied covering the operative field. A sterile gown and sterile gloves were used for the procedure. Local anesthesia was provided with 1% Lidocaine. CT was performed in a prone position. Under CT guidance, an 18 gauge trocar needle was advanced into the retroperitoneal space. Aspiration was performed at the level of the right retroperitoneal abscess and a fluid sample sent for culture analysis. A guidewire was advanced into the collection. A 12 French percutaneous drain was then advanced over the  wire. Drain positioning was confirmed by CT. The catheter was flushed with saline and attached to a suction bulb. The catheter was secured at the skin with a Prolene retention suture and adhesive StatLock device. COMPLICATIONS: None FINDINGS: Aspiration at the level of the complex right posterior retroperitoneal abscess yielded very thick, purulent fluid. A drainage catheter was able to be formed in the complex collection. Output will be followed. IMPRESSION: CT-guided drainage of right retroperitoneal abscess yielding purulent fluid. A 12 French drain was placed and attached to suction bulb drainage. Electronically Signed   By: Irish Lack M.D.   On: 02/15/2015 13:22      Lab Results  Component Value Date   HGBA1C 16.5* 02/14/2015   Lab Results  Component Value Date   CREATININE 0.65 02/16/2015       Scheduled Meds: . heparin  5,000 Units Subcutaneous 3 times per day  . insulin aspart  0-9 Units Subcutaneous TID WC  . insulin glargine  40 Units Subcutaneous Daily  . piperacillin-tazobactam (ZOSYN)  IV  3.375 g Intravenous 3 times per day  . vancomycin  1,500 mg Intravenous Once  . vancomycin  1,000 mg Intravenous Q12H   Continuous Infusions: . sodium chloride 75 mL/hr at 02/16/15 0858  . insulin (NOVOLIN-R) infusion Stopped (02/15/15 1311)    Principal Problem:   Perinephric abscess Active Problems:   Poorly controlled diabetes mellitus (HCC)   DKA (diabetic ketoacidoses) (HCC)   Hyponatremia   Essential hypertension    Time spent: 45 minutes   [facility]  [facility] Pager [phone]. If 7PM-7AM, please contact night-coverage at [email or web address], password [facility] 02/16/2015, 12:36 PM  LOS: 2 days

## 2015-02-16 NOTE — Progress Notes (Signed)
ANTIBIOTIC CONSULT NOTE - INITIAL  Pharmacy Consult for Vancomycin Indication: Perinephric abscess culture growing GPC, clusters No Known Allergies  Patient Measurements: Height: 4\' 8"  (142.2 cm) Weight: 223 lb 5.2 oz (101.3 kg) IBW/kg (Calculated) : 36.3   Vital Signs: Temp: 97.7 F (36.5 C) (10/12 0458) Temp Source: Oral (10/12 0458) BP: 90/40 mmHg (10/12 0458) Pulse Rate: 79 (10/12 0458) Intake/Output from previous day: 10/11 0701 - 10/12 0700 In: 330 [P.O.:220; IV Piggyback:100] Out: 795 [Urine:700; Drains:95] Intake/Output from this shift:    Labs:  Recent Labs  02/14/15 1100  02/15/15 0431 02/15/15 0725 02/15/15 1110 02/16/15 0528  WBC 12.9*  --  10.3  --   --  6.0  HGB 13.8  --  12.6  --   --  11.6*  PLT 342  --  297  --   --  259  CREATININE 0.92  < > 0.56 0.57 0.58 0.65  < > = values in this interval not displayed. Estimated Creatinine Clearance: 89.2 mL/min (by C-G formula based on Cr of 0.65). No results for input(s): VANCOTROUGH, VANCOPEAK, VANCORANDOM, GENTTROUGH, GENTPEAK, GENTRANDOM, TOBRATROUGH, TOBRAPEAK, TOBRARND, AMIKACINPEAK, AMIKACINTROU, AMIKACIN in the last 72 hours.   Microbiology: Recent Results (from the past 720 hour(s))  Urine culture     Status: None   Collection Time: 02/14/15 11:39 AM  Result Value Ref Range Status   Specimen Description URINE, CLEAN CATCH  Final   Special Requests NONE  Final   Culture MULTIPLE SPECIES PRESENT, SUGGEST RECOLLECTION  Final   Report Status 02/15/2015 FINAL  Final  Culture, blood (routine x 2)     Status: None (Preliminary result)   Collection Time: 02/14/15  8:25 PM  Result Value Ref Range Status   Specimen Description BLOOD RIGHT HAND  Final   Special Requests BOTTLES DRAWN AEROBIC AND ANAEROBIC 7CC  Final   Culture NO GROWTH < 24 HOURS  Final   Report Status PENDING  Incomplete  Culture, blood (routine x 2)     Status: None (Preliminary result)   Collection Time: 02/14/15  8:35 PM  Result  Value Ref Range Status   Specimen Description BLOOD RIGHT HAND  Final   Special Requests BOTTLES DRAWN AEROBIC AND ANAEROBIC 5CC  Final   Culture NO GROWTH < 24 HOURS  Final   Report Status PENDING  Incomplete  Culture, routine-abscess     Status: None (Preliminary result)   Collection Time: 02/15/15  3:30 PM  Result Value Ref Range Status   Specimen Description ABSCESS RIGHT ABDOMEN  Final   Special Requests Normal  Final   Gram Stain   Final    FEW WBC PRESENT,BOTH PMN AND MONONUCLEAR NO SQUAMOUS EPITHELIAL CELLS SEEN MODERATE GRAM POSITIVE COCCI IN PAIRS IN CLUSTERS [facility]    Culture PENDING  Incomplete   Report Status PENDING  Incomplete  Anaerobic culture     Status: None (Preliminary result)   Collection Time: 02/15/15  3:30 PM  Result Value Ref Range Status   Specimen Description ABSCESS RIGHT ABDOMEN  Final   Special Requests Normal  Final   Gram Stain   Final    FEW WBC PRESENT,BOTH PMN AND MONONUCLEAR NO SQUAMOUS EPITHELIAL CELLS SEEN MODERATE GRAM POSITIVE COCCI IN PAIRS IN CLUSTERS Performed at [facility]    Culture PENDING  Incomplete   Report Status PENDING  Incomplete    Medical History: Past Medical History  Diagnosis Date  . Obesity   . Hypertension   .  Diabetes mellitus without complication (HCC)     Medications:  Scheduled:  . heparin  5,000 Units Subcutaneous 3 times per day  . insulin aspart  0-9 Units Subcutaneous TID WC  . insulin glargine  40 Units Subcutaneous Daily  . piperacillin-tazobactam (ZOSYN)  IV  3.375 g Intravenous 3 times per day   Assessment: 44 y.o female with perinephric abscess on day#3 of Zosyn. Abscess right abdomen culture growing GPC in pairs, clusters-speiciation and sensitivities pending.  Pharmacy consulted to add IV vancomycin.  WBC 6.0K, Afebrile, SCr 0.65, estimated CrCl ~80 ml/min (using IBW minimum 45.5 kg).   Goal of Therapy:  Vancomycin trough level 10-15 mcg/ml  Plan:   Vancomycin 1500 mg IV x1 then 1000 mg IV q12h Continue Zosyn 3.375 gm IV q8h (4h EI) per MD dosing Monitor clinical status, culture results, vanc trough at steady state.   Thank you for allowing pharmacy to be part of this patients care team.  Noah Delaine, RPh Clinical Pharmacist Pager: [phone] 02/16/2015,9:32 AM

## 2015-02-16 NOTE — Progress Notes (Signed)
RN educated pt and family on drain care and dressing change. Pt and family verbalized and demonstrated understanding.   Lujean AmelKadeesha Claudie Rathbone,RN

## 2015-02-17 LAB — CBC
HCT: 36.4 % (ref 36.0–46.0)
Hemoglobin: 12.1 g/dL (ref 12.0–15.0)
MCH: 30 pg (ref 26.0–34.0)
MCHC: 33.2 g/dL (ref 30.0–36.0)
MCV: 90.3 fL (ref 78.0–100.0)
PLATELETS: 277 10*3/uL (ref 150–400)
RBC: 4.03 MIL/uL (ref 3.87–5.11)
RDW: 12.5 % (ref 11.5–15.5)
WBC: 4.8 10*3/uL (ref 4.0–10.5)

## 2015-02-17 LAB — COMPREHENSIVE METABOLIC PANEL
ALT: 13 U/L — ABNORMAL LOW (ref 14–54)
ANION GAP: 8 (ref 5–15)
AST: 25 U/L (ref 15–41)
Albumin: 1.8 g/dL — ABNORMAL LOW (ref 3.5–5.0)
Alkaline Phosphatase: 140 U/L — ABNORMAL HIGH (ref 38–126)
BUN: 6 mg/dL (ref 6–20)
CHLORIDE: 103 mmol/L (ref 101–111)
CO2: 24 mmol/L (ref 22–32)
CREATININE: 0.61 mg/dL (ref 0.44–1.00)
Calcium: 7.8 mg/dL — ABNORMAL LOW (ref 8.9–10.3)
Glucose, Bld: 255 mg/dL — ABNORMAL HIGH (ref 65–99)
Potassium: 3.6 mmol/L (ref 3.5–5.1)
SODIUM: 135 mmol/L (ref 135–145)
Total Bilirubin: 0.4 mg/dL (ref 0.3–1.2)
Total Protein: 6.3 g/dL — ABNORMAL LOW (ref 6.5–8.1)

## 2015-02-17 LAB — GLUCOSE, CAPILLARY
GLUCOSE-CAPILLARY: 340 mg/dL — AB (ref 65–99)
GLUCOSE-CAPILLARY: 224 mg/dL — AB (ref 65–99)
GLUCOSE-CAPILLARY: 273 mg/dL — AB (ref 65–99)
GLUCOSE-CAPILLARY: 316 mg/dL — AB (ref 65–99)

## 2015-02-17 MED ORDER — INSULIN GLARGINE 100 UNIT/ML ~~LOC~~ SOLN
45.0000 [IU] | Freq: Every day | SUBCUTANEOUS | Status: DC
  Administered 2015-02-17: 45 [IU] via SUBCUTANEOUS
  Filled 2015-02-17: qty 0.45

## 2015-02-17 MED ORDER — AMOXICILLIN 500 MG PO TABS: 500.0000 mg | ORAL_TABLET | Freq: Three times a day (TID) | ORAL | Status: AC

## 2015-02-17 MED ORDER — INSULIN ASPART 100 UNIT/ML ~~LOC~~ SOLN: 5.0000 [IU] | Freq: Three times a day (TID) | SUBCUTANEOUS | Status: DC

## 2015-02-17 MED ORDER — INSULIN GLARGINE 100 UNIT/ML ~~LOC~~ SOLN: 50.0000 [IU] | Freq: Every day | SUBCUTANEOUS | Status: DC

## 2015-02-17 NOTE — Progress Notes (Signed)
PT given warm prune juice this am to see if bowels will move later this morning. Pt has no complaints of pain. No sign of distress noted.

## 2015-02-17 NOTE — Discharge Summary (Signed)
Physician Discharge Summary  Mandy Garza MRN: [id number] DOB/AGE: 43-Jan-1972 43 y.o.  PCP: No primary care provider on file.   Admit date: 02/14/2015 Discharge date: 02/17/2015  Discharge Diagnoses:     Principal Problem:   Perinephric abscess Active Problems:   Poorly controlled diabetes mellitus ([place])   DKA (diabetic ketoacidoses) ([place])   Hyponatremia   Essential hypertension    Follow-up recommendations Follow-up with PCP in 3-5 days , including all  additional recommended appointments as below Follow-up CBC, CMP in 3-5 days Patient needs to follow-up with interventional radiology in the drain clinic Will need a repeat CT scan prior to removal of the drain      Medication List    STOP taking these medications        diclofenac 75 MG EC tablet  Commonly known as:  VOLTAREN     glimepiride 2 MG tablet  Commonly known as:  AMARYL     lisinopril-hydrochlorothiazide 10-12.5 MG tablet  Commonly known as:  PRINZIDE,ZESTORETIC     metFORMIN 1000 MG tablet  Commonly known as:  GLUCOPHAGE      TAKE these medications        amoxicillin 500 MG tablet  Commonly known as:  AMOXIL  Take 1 tablet (500 mg total) by mouth 3 (three) times daily.     insulin aspart 100 UNIT/ML injection  Commonly known as:  NOVOLOG  Inject 5 Units into the skin 3 (three) times daily with meals.     insulin glargine 100 UNIT/ML injection  Commonly known as:  LANTUS  Inject 0.5 mLs (50 Units total) into the skin daily.         Discharge Condition: Stable  Disposition: Final discharge disposition not confirmed   Consults:  Interventional radiology   Significant Diagnostic Studies:  Ct Abdomen Pelvis Wo Contrast  02/14/2015  CLINICAL DATA:  Right-sided flank pain radiating to the right abdomen. Recent diagnosis of pyelonephritis. EXAM: CT ABDOMEN AND PELVIS WITHOUT CONTRAST TECHNIQUE: Multidetector CT imaging of the abdomen and pelvis was performed following the  standard protocol without IV contrast. COMPARISON:  None. FINDINGS: The lack of intravenous contrast limits the ability to evaluate solid abdominal organs. There is ill-defined masslike stranding about the posterior aspect of the inferior pole the right kidney with dominant component measuring at least 3.5 x 6.3 x 6.2 cm (as measured in greatest oblique axial - image 47, series 2 ; and coronal - image 65, series 4, dimensions respectively. This ill-defined stranding effaces the adjacent right iliopsoas musculature as well as the posterior inferior aspect of the right kidney. There is minimal pelviectasis involving the superior pole of the right kidney. No renal stones. No discrete areas of osteolysis involving the adjacent vertebral bodies. Adjacent vertebral body heights appear preserved. Normal noncontrast appearance of the left kidney. Normal hepatic contour. Normal noncontrast appearance of the gallbladder. No radiopaque gallstones. No ascites. Note is made of an approximately 1.9 cm nodule within the crux of the right adrenal gland which measures approximately 23 Hounsfield units and [place] indeterminate on this noncontrast examination (image 29, series 2). Normal noncontrast appearance of the left adrenal gland, pancreas and spleen. Moderate colonic stool burden without evidence of enteric obstruction. The bowel is normal in course and caliber without wall thickening or evidence of obstruction. Normal noncontrast appearance of the retrocecal appendix. No pneumoperitoneum, pneumatosis or portal venous gas. Scattered very minimal amount of atherosclerotic plaque within a normal caliber abdominal aorta. Scattered shotty retroperitoneal lymph nodes are numerous though individually  not enlarged by size criteria with index pre aortic lymph node measuring 0.5 cm in greatest short axis diameter. No definitive bulky retroperitoneal, mesenteric, pelvic or inguinal lymphadenopathy on this noncontrast examination. There is a  small amount of air seen within the urinary bladder (image 80, series 3), potentially the sequela of recent in and out Foley catheterization. Otherwise, normal noncontrast appearance of the urinary bladder. Post hysterectomy. No discrete adnexal lesions. No free fluid in the pelvic cul-de-sac. Limited visualization the lower thorax is negative for focal airspace opacity or pleural effusion. Normal heart size.  No pericardial effusion. No acute or aggressive osseous abnormalities. Stigmata of DISH within the imaged caudal aspect of the thoracic spine. Regional soft tissues appear normal. IMPRESSION: 1. Ill-defined masslike stranding involving the posterior inferior aspect the right kidney effacing the adjacent right iliopsoas musculature - while incompletely characterized on this noncontrast examination, given history of recent diagnosis of pyelonephritis, this is favored to represent a developing renal/perinephric abscess but may be further evaluated with contrast-enhanced abdominal CT as clinically indicated. 2. Very mild pelviectasis involving the superior pole of the right kidney, likely secondary to mass effect from the adjacent infectious/inflammatory process. No renal stones. 3. Incidentally noted approximate nodule within the crux of the right adrenal gland, likely representative of a benign adrenal adenoma though incompletely characterized the present examination. This could be further evaluated with nonemergent MRI as clinically indicated. Critical Value/emergent results were called by telephone at the time of interpretation on 02/14/2015 at 1:16 pm to Dr. Jola Schmidt , who verbally acknowledged these results. Electronically Signed   By: Sandi Mariscal M.D.   On: 02/14/2015 13:19   Ct Abdomen Pelvis W Contrast  02/14/2015  CLINICAL DATA:  Possible perinephric abscess detected on non contrasted CT scan to further evaluate EXAM: CT ABDOMEN AND PELVIS WITH CONTRAST TECHNIQUE: Multidetector CT imaging of the  abdomen and pelvis was performed using the standard protocol following bolus administration of intravenous contrast. CONTRAST:  148m OMNIPAQUE IOHEXOL 300 MG/ML  SOLN COMPARISON:  02/14/2015 FINDINGS: Lower chest:  No acute findings. Hepatobiliary: Mild steatosis Pancreas: No mass, inflammatory changes, or other significant abnormality. Spleen: Within normal limits in size and appearance. Adrenals/Urinary Tract: 19 mm right adrenal nodule shows postcontrast attenuation value of 54. 35 x 63 x 62 mm perinephric soft tissue abnormality on the right, inseparable from and causing mass effect upon the posterior cortex lower pole right kidney, also inseparable from right paraspinous soft tissues and right psoas musculature. Average attenuation value is 35 with numerous mildly enhancing internal septations. Surrounding inflammatory change noted as previously described. No hydronephrosis. Left kidney and adrenal gland normal. Trace air again noted in the bladder as previously described. Stomach/Bowel: No evidence of obstruction, inflammatory process, or abnormal fluid collections. Vascular/Lymphatic: As previously described numerous nonpathologic retroperitoneal lymph nodes noted Reproductive: No mass or other significant abnormality. Other: None. Musculoskeletal:  No suspicious bone lesions identified. IMPRESSION: 1. Postcontrast images suggesting right perinephric abscess with extrinsic mass effect on the kidney although there is no hydronephrosis appreciated. Would suggest appropriate treatment with follow-up imaging to ensure resolution. 2. Indeterminate right adrenal nodule. As previously recommended, nonemergent MRI suggested. Electronically Signed   By: RSkipper ClicheM.D.   On: 02/14/2015 14:17   Ct Image Guided Drainage By Percutaneous Catheter  02/15/2015  CLINICAL DATA:  Right-sided retroperitoneal abscess located posterior to the lower pole of the right kidney and also involving the adjacent iliopsoas  muscle. The patient presents for drainage. EXAM: CT GUIDED PERCUTANEOUS  CATHETER DRAINAGE OF RIGHT RETROPERITONEAL ABSCESS ANESTHESIA/SEDATION: 1.0 Mg IV Versed 50 mcg IV Fentanyl Total Moderate Sedation Time:  15 minutes. PROCEDURE: The procedure, risks, benefits, and alternatives were explained to the patient. Questions regarding the procedure were encouraged and answered. The patient understands and consents to the procedure. A time-out was performed prior to the procedure. The right flank region was prepped with Betadine in a sterile fashion, and a sterile drape was applied covering the operative field. A sterile gown and sterile gloves were used for the procedure. Local anesthesia was provided with 1% Lidocaine. CT was performed in a prone position. Under CT guidance, an 18 gauge trocar needle was advanced into the retroperitoneal space. Aspiration was performed at the level of the right retroperitoneal abscess and a fluid sample sent for culture analysis. A guidewire was advanced into the collection. A 12 French percutaneous drain was then advanced over the wire. Drain positioning was confirmed by CT. The catheter was flushed with saline and attached to a suction bulb. The catheter was secured at the skin with a Prolene retention suture and adhesive StatLock device. COMPLICATIONS: None FINDINGS: Aspiration at the level of the complex right posterior retroperitoneal abscess yielded very thick, purulent fluid. A drainage catheter was able to be formed in the complex collection. Output will be followed. IMPRESSION: CT-guided drainage of right retroperitoneal abscess yielding purulent fluid. A 12 French drain was placed and attached to suction bulb drainage. Electronically Signed   By: Aletta Edouard M.D.   On: 02/15/2015 13:22      Filed Weights   02/14/15 1947 02/15/15 1958 02/16/15 2200  Weight: 99.6 kg (219 lb 9.3 oz) 101.3 kg (223 lb 5.2 oz) 106.4 kg (234 lb 9.1 oz)     Microbiology: Recent  Results (from the past 240 hour(s))  Urine culture     Status: None   Collection Time: 02/14/15 11:39 AM  Result Value Ref Range Status   Specimen Description URINE, CLEAN CATCH  Final   Special Requests NONE  Final   Culture MULTIPLE SPECIES PRESENT, SUGGEST RECOLLECTION  Final   Report Status 02/15/2015 FINAL  Final  Culture, blood (routine x 2)     Status: None (Preliminary result)   Collection Time: 02/14/15  8:25 PM  Result Value Ref Range Status   Specimen Description BLOOD RIGHT HAND  Final   Special Requests BOTTLES DRAWN AEROBIC AND ANAEROBIC [place]  Final   Culture NO GROWTH 2 DAYS  Final   Report Status PENDING  Incomplete  Culture, blood (routine x 2)     Status: None (Preliminary result)   Collection Time: 02/14/15  8:35 PM  Result Value Ref Range Status   Specimen Description BLOOD RIGHT HAND  Final   Special Requests BOTTLES DRAWN AEROBIC AND ANAEROBIC 5CC  Final   Culture NO GROWTH 2 DAYS  Final   Report Status PENDING  Incomplete  Culture, routine-abscess     Status: None (Preliminary result)   Collection Time: 02/15/15  3:30 PM  Result Value Ref Range Status   Specimen Description ABSCESS RIGHT ABDOMEN  Final   Special Requests Normal  Final   Gram Stain   Final    FEW WBC PRESENT,BOTH PMN AND MONONUCLEAR NO SQUAMOUS EPITHELIAL CELLS SEEN MODERATE GRAM POSITIVE COCCI IN PAIRS IN CLUSTERS Performed at [facility]    Culture   Final    ABUNDANT GROUP B STREP(S.AGALACTIAE)ISOLATED Note: TESTING AGAINST S. AGALACTIAE NOT ROUTINELY PERFORMED DUE TO PREDICTABILITY OF AMP/PEN/VAN SUSCEPTIBILITY. Performed at  [facility]    Report Status PENDING  Incomplete  Anaerobic culture     Status: None (Preliminary result)   Collection Time: 02/15/15  3:30 PM  Result Value Ref Range Status   Specimen Description ABSCESS RIGHT ABDOMEN  Final   Special Requests Normal  Final   Gram Stain   Final    FEW WBC PRESENT,BOTH PMN AND MONONUCLEAR NO SQUAMOUS  EPITHELIAL CELLS SEEN MODERATE GRAM POSITIVE COCCI IN PAIRS IN CLUSTERS Performed at [facility]    Culture PENDING  Incomplete   Report Status PENDING  Incomplete       Blood Culture    Component Value Date/Time   SDES ABSCESS RIGHT ABDOMEN 02/15/2015 1530   SDES ABSCESS RIGHT ABDOMEN 02/15/2015 1530   SPECREQUEST Normal 02/15/2015 1530   SPECREQUEST Normal 02/15/2015 1530   CULT  02/15/2015 1530    ABUNDANT GROUP B STREP(S.AGALACTIAE)ISOLATED Note: TESTING AGAINST S. AGALACTIAE NOT ROUTINELY PERFORMED DUE TO PREDICTABILITY OF AMP/PEN/VAN SUSCEPTIBILITY. Performed at [place] [facility] PENDING 02/15/2015 1530   REPTSTATUS PENDING 02/15/2015 1530   REPTSTATUS PENDING 02/15/2015 1530      Labs: Results for orders placed or performed during the hospital encounter of 02/14/15 (from the past 48 hour(s))  Glucose, capillary     Status: Abnormal   Collection Time: 02/15/15  1:05 PM  Result Value Ref Range   Glucose-Capillary 135 (H) 65 - 99 mg/dL  Culture, routine-abscess     Status: None (Preliminary result)   Collection Time: 02/15/15  3:30 PM  Result Value Ref Range   Specimen Description ABSCESS RIGHT ABDOMEN    Special Requests Normal    Gram Stain      FEW WBC PRESENT,BOTH PMN AND MONONUCLEAR NO SQUAMOUS EPITHELIAL CELLS SEEN MODERATE GRAM POSITIVE COCCI IN PAIRS IN CLUSTERS Performed at [facility] B STREP(S.AGALACTIAE)ISOLATED Note: TESTING AGAINST S. AGALACTIAE NOT ROUTINELY PERFORMED DUE TO PREDICTABILITY OF AMP/PEN/VAN SUSCEPTIBILITY. Performed at [facility]    Report Status PENDING   Anaerobic culture     Status: None (Preliminary result)   Collection Time: 02/15/15  3:30 PM  Result Value Ref Range   Specimen Description ABSCESS RIGHT ABDOMEN    Special Requests Normal    Gram Stain      FEW WBC PRESENT,BOTH PMN AND MONONUCLEAR NO SQUAMOUS EPITHELIAL CELLS SEEN MODERATE GRAM  POSITIVE COCCI IN PAIRS IN CLUSTERS Performed at [facility]    Culture PENDING    Report Status PENDING   Glucose, capillary     Status: Abnormal   Collection Time: 02/15/15  4:46 PM  Result Value Ref Range   Glucose-Capillary 321 (H) 65 - 99 mg/dL   Comment 1 Notify RN    Comment 2 Document in Chart   Glucose, capillary     Status: Abnormal   Collection Time: 02/15/15  7:56 PM  Result Value Ref Range   Glucose-Capillary 291 (H) 65 - 99 mg/dL  CBC     Status: Abnormal   Collection Time: 02/16/15  5:28 AM  Result Value Ref Range   WBC 6.0 4.0 - 10.5 K/uL   RBC 3.88 3.87 - 5.11 MIL/uL   Hemoglobin 11.6 (L) 12.0 - 15.0 g/dL   HCT 34.9 (L) 36.0 - 46.0 %   MCV 89.9 78.0 - 100.0 fL   MCH 29.9 26.0 - 34.0 pg   MCHC 33.2 30.0 - 36.0 g/dL  RDW 12.6 11.5 - 15.5 %   Platelets 259 150 - 400 K/uL  Comprehensive metabolic panel     Status: Abnormal   Collection Time: 02/16/15  5:28 AM  Result Value Ref Range   Sodium 132 (L) 135 - 145 mmol/L   Potassium 4.2 3.5 - 5.1 mmol/L    Comment: DELTA CHECK NOTED   Chloride 100 (L) 101 - 111 mmol/L   CO2 23 22 - 32 mmol/L   Glucose, Bld 350 (H) 65 - 99 mg/dL   BUN 6 6 - 20 mg/dL   Creatinine, Ser 0.65 0.44 - 1.00 mg/dL   Calcium 8.0 (L) 8.9 - 10.3 mg/dL   Total Protein 6.7 6.5 - 8.1 g/dL   Albumin 1.8 (L) 3.5 - 5.0 g/dL   AST 21 15 - 41 U/L   ALT 13 (L) 14 - 54 U/L   Alkaline Phosphatase 159 (H) 38 - 126 U/L   Total Bilirubin 0.6 0.3 - 1.2 mg/dL   GFR calc non Af Amer >60 >60 mL/min   GFR calc Af Amer >60 >60 mL/min    Comment: (NOTE) The eGFR has been calculated using the CKD EPI equation. This calculation has not been validated in all clinical situations. eGFR's persistently <60 mL/min signify possible Chronic Kidney Disease.    Anion gap 9 5 - 15  Glucose, capillary     Status: Abnormal   Collection Time: 02/16/15  7:31 AM  Result Value Ref Range   Glucose-Capillary 319 (H) 65 - 99 mg/dL  Glucose, capillary      Status: Abnormal   Collection Time: 02/16/15 11:52 AM  Result Value Ref Range   Glucose-Capillary 342 (H) 65 - 99 mg/dL  Glucose, capillary     Status: Abnormal   Collection Time: 02/16/15  4:24 PM  Result Value Ref Range   Glucose-Capillary 295 (H) 65 - 99 mg/dL  Glucose, capillary     Status: Abnormal   Collection Time: 02/16/15 10:02 PM  Result Value Ref Range   Glucose-Capillary 308 (H) 65 - 99 mg/dL  Glucose, capillary     Status: Abnormal   Collection Time: 02/17/15  4:13 AM  Result Value Ref Range   Glucose-Capillary 340 (H) 65 - 99 mg/dL  CBC     Status: None   Collection Time: 02/17/15  6:26 AM  Result Value Ref Range   WBC 4.8 4.0 - 10.5 K/uL   RBC 4.03 3.87 - 5.11 MIL/uL   Hemoglobin 12.1 12.0 - 15.0 g/dL   HCT 36.4 36.0 - 46.0 %   MCV 90.3 78.0 - 100.0 fL   MCH 30.0 26.0 - 34.0 pg   MCHC 33.2 30.0 - 36.0 g/dL   RDW 12.5 11.5 - 15.5 %   Platelets 277 150 - 400 K/uL  Comprehensive metabolic panel     Status: Abnormal   Collection Time: 02/17/15  6:26 AM  Result Value Ref Range   Sodium 135 135 - 145 mmol/L   Potassium 3.6 3.5 - 5.1 mmol/L   Chloride 103 101 - 111 mmol/L   CO2 24 22 - 32 mmol/L   Glucose, Bld 255 (H) 65 - 99 mg/dL   BUN 6 6 - 20 mg/dL   Creatinine, Ser 0.61 0.44 - 1.00 mg/dL   Calcium 7.8 (L) 8.9 - 10.3 mg/dL   Total Protein 6.3 (L) 6.5 - 8.1 g/dL   Albumin 1.8 (L) 3.5 - 5.0 g/dL   AST 25 15 - 41 U/L   ALT 13 (L)  14 - 54 U/L   Alkaline Phosphatase 140 (H) 38 - 126 U/L   Total Bilirubin 0.4 0.3 - 1.2 mg/dL   GFR calc non Af Amer >60 >60 mL/min   GFR calc Af Amer >60 >60 mL/min    Comment: (NOTE) The eGFR has been calculated using the CKD EPI equation. This calculation has not been validated in all clinical situations. eGFR's persistently <60 mL/min signify possible Chronic Kidney Disease.    Anion gap 8 5 - 15  Glucose, capillary     Status: Abnormal   Collection Time: 02/17/15  7:41 AM  Result Value Ref Range   Glucose-Capillary 224  (H) 65 - 99 mg/dL  Glucose, capillary     Status: Abnormal   Collection Time: 02/17/15 12:20 PM  Result Value Ref Range   Glucose-Capillary 273 (H) 65 - 99 mg/dL     Lipid Panel  No results found for: CHOL, TRIG, HDL, CHOLHDL, VLDL, LDLCALC, LDLDIRECT   Lab Results  Component Value Date   HGBA1C 16.6* 02/15/2015   HGBA1C 16.5* 02/14/2015     Lab Results  Component Value Date   CREATININE 0.61 02/17/2015     HPI :Mandy Garza is a 44 y.o. female with complaints of right flank pain and fevers x 3 weeks and 1 week prior to that has also been feeling fatigue. She also c/o nausea with vomiting- last emesis episode yesterday. She was seen at health clinic and started on Bactrim 1 week ago without relief and presented to ED 10/10 CT imaging revealed right perinephric/psoas fluid collection concern for abscess. She was started on IV zosyn and states her nausea has slightly improved. She rates her current right flank pain 6/10. IR received request for image guided drain placement  HOSPITAL COURSE:   Perinephric abscess-S/p perc drain 10/11, wbc trending down wnl, afebrile, output purulent, H/H stable, Cx GROUP B STREP(S.AGALACTIAE, patient initially treated with IV Zosyn, and add vancomycin, switch to amoxicillin 500 mg 3 times a day 3 weeks, patient to follow-up at the interventional radiology drain clinic, may need a repeat CT scan prior to removal of the drain  Diabetic ketoacidosis-anion gap closed, hemoglobin A1c 16.6, initially placed on glucose stabilizer, we have discontinued oral hypoglycemic agents and the patient will be started on Lantus 50 units daily, NovoLog pre-meal 5 units 3 times a day   Hypokalemia/hypomagnesemia repleted-   Hyponatremia secondary to dehydration, now improving with IV fluids  Hypertension discontinue antihypertensives given soft blood pressure, these may be restarted by her PCP if  blood pressure improved   DVT prophylaxsis , start heparin  after procedure    Discharge Exam:    Blood pressure 114/78, pulse 75, temperature 98 F (36.7 C), temperature source Oral, resp. rate 18, height 4' 8"  (1.422 m), weight 106.4 kg (234 lb 9.1 oz), SpO2 96 %. General: No acute respiratory distress Lungs: Clear to auscultation bilaterally without wheezes or crackles Cardiovascular: Regular rate and rhythm without murmur gallop or rub normal S1 and S2 Abdomen: Right nephrostomy tube, nondistended, soft, bowel sounds positive, no rebound, no ascites, no appreciable mass Extremities: No significant cyanosis, clubbing, or edema bilateral lower extremities        Discharge Instructions    Diet - low sodium heart healthy    Complete by:  As directed      Increase activity slowly    Complete by:  As directed            Follow-up Information    Follow  up with [facility]    .   Why:  Appointment ;  Thursday, February 24, 2015 at 12:00 noon, please call if you are unable to keep this appointment.   Contact information:   [address] [place] [address] [phone]      Signed: Reyne Dumas 02/17/2015, 12:24 PM        Time spent >45 mins

## 2015-02-17 NOTE — Progress Notes (Signed)
Patient Discharge:  Disposition: Pt discharged home with family  Education: Pt educated on medications, follow appointments, drain care and all discharge instructions. Pt verbalized understanding. Pt demonstrated understanding of drain care. Pt also educated on the importance of monitoring CBGs, taking insulin as prescribe and monitoring diet. Pt verbalized understanding.   IV: Removed.   Telemetry: Removed. CCMD notified.  Follow-up appointments: Reviewed with pt.  Prescriptions: Scripts sent to pt's pharmacy  Transportation: Pt transported home by family  Belongings: All belongings  taken with pt.

## 2015-02-17 NOTE — Care Management Note (Signed)
Case Management Note  Patient Details  Name: Mandy Garza MRN: [id number] Date of Birth: 04/18/1971  Subjective/Objective:         CM following for progression and d/c planning.           Action/Plan: Pt for d/c to home and I have ask [facility] to review this pt to [facility] post d/c. This CM has ask staff to begin training pt and family re care/flushing of her drain.  Pt has no PCP, therefore [facility], appointment time, address and phone number in d/c instructions. Pt RN notified and will review with pt at the time of d/c.   Expected Discharge Date:                  Expected Discharge Plan:  Home w Home Health Services  In-House Referral:  NA  Discharge planning Services  CM Consult, Indigent Health Clinic  Post Acute Care Choice:  [place] Choice offered to:  [place]  DME Arranged:    DME Agency:     HH Arranged:  RN HH Agency:  [facility]  Status of Service:  In process, will continue to follow  Medicare Important Message Given:    Date Medicare IM Given:    Medicare IM give by:    Date Additional Medicare IM Given:    Additional Medicare Important Message give by:     If discussed at Long Length of Stay Meetings, dates discussed:    Additional Comments:  Starlyn SkeansRoyal, Kendi Defalco U, RN 02/17/2015, 11:45 AM

## 2015-02-17 NOTE — Progress Notes (Signed)
Inpatient Diabetes Program Recommendations  AACE/ADA: New Consensus Statement on Inpatient Glycemic Control (2015)  Target Ranges:  Prepandial:   less than 140 mg/dL      Peak postprandial:   less than 180 mg/dL (1-2 hours)      Critically ill patients:  140 - 180 mg/dL   Results for Mandy Garza, Arletha (MRN [id number]) as of 02/17/2015 10:00  Ref. Range 02/16/2015 07:31 02/16/2015 11:52 02/16/2015 16:24 02/16/2015 22:02 02/17/2015 04:13 02/17/2015 07:41  Glucose-Capillary Latest Ref Range: 65-99 mg/dL 782319 (H) 956342 (H) 213295 (H) 308 (H) 340 (H) 224 (H)   Review of Glycemic Control  Diabetes history: DM 2 Outpatient Diabetes medications: Amaryl 2 mg Daily, Metformin 1,000 mg BID Current orders for Inpatient glycemic control: Lantus 45 units, Novolog Sensitive Q4 hrs  Inpatient Diabetes Program Recommendations:  Insulin - Basal: Fasting glucose this am 224 mg/dl. Patient received a total of 50 units of Lantus yesterday 10/12. Please consider increasing basal insulin to Lantus 56 units Q 24 hrs. Correction (SSI): Please consider increase in correction scale to moderate tidwc HgbA1C: 16.6% on 02/15/15. Patient will need insulin upon discharge.  Thanks,  Christena DeemShannon Betania Dizon RN, MSN, [facility] Inpatient Diabetes Coordinator Team Pager [phone] (8a-5p)

## 2015-02-17 NOTE — Progress Notes (Signed)
Inpatient Diabetes Program Recommendations  AACE/ADA: New Consensus Statement on Inpatient Glycemic Control (2015)  Target Ranges:  Prepandial:   less than 140 mg/dL      Peak postprandial:   less than 180 mg/dL (1-2 hours)      Critically ill patients:  140 - 180 mg/dL   Spoke with patient about diabetes and home regimen for diabetes control. Patient reports that she [place] [address] [place] the name) for diabetes management and currently she takes Armaryl 2 mg Daily, and Metformin 1000 mg BID as an outpatient for diabetes control.  Patient reports that she had been on 70/30 25 units BID at one time for a period of a year but felt nauseous off and on the whole time. She stopped taking the medication and was then placed on Metformin and Amaryl. Patient reports she was not taking her medicine until 2 weeks ago. She was not checking her glucose until the day she came in. Glucose 400-600 range at that time. Inquired about knowledge about A1C and patient reports that she does not know what an A1C is. Discussed A1C results (16.6% on 02/15/2015) and explained what an A1C is, basic pathophysiology of DM Type 2, basic home care, importance of checking CBGs and maintaining good CBG control to prevent long-term and short-term complications. Discussed impact of nutrition, exercise, stress, sickness, and medications on diabetes control.  Patient states that she eats in the am and then does not eat until dinner time that evening. She also reports eating beans and several tortillas.  Discussed carbohydrates, carbohydrate goals per day and meal, along with portion sizes.  Patient is agreeable to nutritional and exercise changes.  Patient verbalized understanding of information discussed and she states that she has no further questions at this time related to diabetes.   Since patient felt sick on 70/30, may consider starting Toujeo on discharge with savings card will  cost the patient $15/month along with her oral medications.  Thanks,  Christena DeemShannon Cortney Beissel RN, MSN, [facility] Inpatient Diabetes Coordinator Team Pager [phone] (8a-5p)

## 2015-02-18 ENCOUNTER — Telehealth: Payer: Self-pay | Admitting: Family Medicine

## 2015-02-18 LAB — CULTURE, ROUTINE-ABSCESS: SPECIAL REQUESTS: NORMAL

## 2015-02-18 NOTE — Telephone Encounter (Signed)
[facility] stated that ssince pt. Can not be able to afford the two insulin medications she went ahead and filled her metformin. Please f/u   Eddie DibblesSandy Stallings- ext. [phone]

## 2015-02-18 NOTE — Telephone Encounter (Signed)
Mandy Garza from home care called stating that pt. Is not able to afford both of her insulin medications. Pt. Was taken off metformin in the hospital, and she would like to know what to do. Pt. Checked her blood sugar this morning and it was 341. Pt. Has an appt. On 02/24/15. Please f/u.

## 2015-02-19 LAB — CULTURE, BLOOD (ROUTINE X 2)
CULTURE: NO GROWTH
CULTURE: NO GROWTH

## 2015-02-20 LAB — ANAEROBIC CULTURE: SPECIAL REQUESTS: NORMAL

## 2015-02-21 ENCOUNTER — Other Ambulatory Visit (HOSPITAL_COMMUNITY): Payer: Self-pay | Admitting: Radiology

## 2015-02-21 DIAGNOSIS — L0291 Cutaneous abscess, unspecified: Secondary | ICD-10-CM

## 2015-02-21 DIAGNOSIS — N151 Renal and perinephric abscess: Secondary | ICD-10-CM

## 2015-02-22 ENCOUNTER — Other Ambulatory Visit (HOSPITAL_COMMUNITY): Payer: Self-pay | Admitting: Interventional Radiology

## 2015-02-22 ENCOUNTER — Other Ambulatory Visit: Payer: Self-pay | Admitting: Internal Medicine

## 2015-02-22 DIAGNOSIS — N151 Renal and perinephric abscess: Secondary | ICD-10-CM

## 2015-02-22 DIAGNOSIS — L0291 Cutaneous abscess, unspecified: Secondary | ICD-10-CM

## 2015-02-24 ENCOUNTER — Encounter: Payer: Self-pay | Admitting: Family Medicine

## 2015-02-24 ENCOUNTER — Ambulatory Visit: Payer: Self-pay | Attending: Family Medicine | Admitting: Family Medicine

## 2015-02-24 ENCOUNTER — Encounter (HOSPITAL_BASED_OUTPATIENT_CLINIC_OR_DEPARTMENT_OTHER): Payer: Self-pay | Admitting: Clinical

## 2015-02-24 VITALS — BP 106/73 | HR 62 | Temp 97.8°F | Resp 18 | Ht <= 58 in | Wt 232.0 lb

## 2015-02-24 DIAGNOSIS — E1165 Type 2 diabetes mellitus with hyperglycemia: Secondary | ICD-10-CM | POA: Insufficient documentation

## 2015-02-24 DIAGNOSIS — Z8249 Family history of ischemic heart disease and other diseases of the circulatory system: Secondary | ICD-10-CM | POA: Insufficient documentation

## 2015-02-24 DIAGNOSIS — Z0000 Encounter for general adult medical examination without abnormal findings: Secondary | ICD-10-CM

## 2015-02-24 DIAGNOSIS — Z6841 Body Mass Index (BMI) 40.0 and over, adult: Secondary | ICD-10-CM | POA: Insufficient documentation

## 2015-02-24 DIAGNOSIS — I10 Essential (primary) hypertension: Secondary | ICD-10-CM | POA: Insufficient documentation

## 2015-02-24 DIAGNOSIS — E669 Obesity, unspecified: Secondary | ICD-10-CM | POA: Insufficient documentation

## 2015-02-24 DIAGNOSIS — Z7984 Long term (current) use of oral hypoglycemic drugs: Secondary | ICD-10-CM | POA: Insufficient documentation

## 2015-02-24 DIAGNOSIS — Z833 Family history of diabetes mellitus: Secondary | ICD-10-CM | POA: Insufficient documentation

## 2015-02-24 DIAGNOSIS — F419 Anxiety disorder, unspecified: Secondary | ICD-10-CM

## 2015-02-24 DIAGNOSIS — N151 Renal and perinephric abscess: Secondary | ICD-10-CM | POA: Insufficient documentation

## 2015-02-24 DIAGNOSIS — F418 Other specified anxiety disorders: Secondary | ICD-10-CM

## 2015-02-24 DIAGNOSIS — F329 Major depressive disorder, single episode, unspecified: Secondary | ICD-10-CM

## 2015-02-24 DIAGNOSIS — F32A Depression, unspecified: Secondary | ICD-10-CM

## 2015-02-24 LAB — GLUCOSE, POCT (MANUAL RESULT ENTRY): POC GLUCOSE: 246 mg/dL — AB (ref 70–99)

## 2015-02-24 LAB — COMPREHENSIVE METABOLIC PANEL
ALT: 23 U/L (ref 6–29)
AST: 39 U/L — ABNORMAL HIGH (ref 10–30)
Albumin: 3.6 g/dL (ref 3.6–5.1)
Alkaline Phosphatase: 129 U/L — ABNORMAL HIGH (ref 33–115)
BUN: 17 mg/dL (ref 7–25)
CHLORIDE: 94 mmol/L — AB (ref 98–110)
CO2: 32 mmol/L — AB (ref 20–31)
CREATININE: 0.77 mg/dL (ref 0.50–1.10)
Calcium: 9.6 mg/dL (ref 8.6–10.2)
Glucose, Bld: 215 mg/dL — ABNORMAL HIGH (ref 65–99)
Potassium: 5.4 mmol/L — ABNORMAL HIGH (ref 3.5–5.3)
SODIUM: 136 mmol/L (ref 135–146)
Total Bilirubin: 0.4 mg/dL (ref 0.2–1.2)
Total Protein: 7.6 g/dL (ref 6.1–8.1)

## 2015-02-24 LAB — CBC WITH DIFFERENTIAL/PLATELET
BASOS PCT: 0 % (ref 0–1)
Basophils Absolute: 0 10*3/uL (ref 0.0–0.1)
EOS ABS: 0.1 10*3/uL (ref 0.0–0.7)
EOS PCT: 1 % (ref 0–5)
HCT: 39.5 % (ref 36.0–46.0)
Hemoglobin: 12.9 g/dL (ref 12.0–15.0)
LYMPHS PCT: 39 % (ref 12–46)
Lymphs Abs: 2.8 10*3/uL (ref 0.7–4.0)
MCH: 30 pg (ref 26.0–34.0)
MCHC: 32.7 g/dL (ref 30.0–36.0)
MCV: 91.9 fL (ref 78.0–100.0)
MONOS PCT: 6 % (ref 3–12)
MPV: 10.4 fL (ref 8.6–12.4)
Monocytes Absolute: 0.4 10*3/uL (ref 0.1–1.0)
NEUTROS ABS: 3.9 10*3/uL (ref 1.7–7.7)
NEUTROS PCT: 54 % (ref 43–77)
PLATELETS: 356 10*3/uL (ref 150–400)
RBC: 4.3 MIL/uL (ref 3.87–5.11)
RDW: 13 % (ref 11.5–15.5)
WBC: 7.3 10*3/uL (ref 4.0–10.5)

## 2015-02-24 MED ORDER — INSULIN GLARGINE 100 UNIT/ML ~~LOC~~ SOLN: 50.0000 [IU] | Freq: Every day | SUBCUTANEOUS | Status: DC

## 2015-02-24 MED ORDER — INSULIN ASPART 100 UNIT/ML ~~LOC~~ SOLN: 5.0000 [IU] | Freq: Three times a day (TID) | SUBCUTANEOUS | Status: DC

## 2015-02-24 NOTE — Progress Notes (Signed)
ASSESSMENT: Pt currently experiencing mild anxiety and mild-moderate symptoms of anxiety and depression. [facility]; would benefit from psychoeducation and supportive counseling regarding coping with symptoms of anxiety and depression.  Stage of Change: contemplative  PLAN: 1. F/U with behavioral health consultant in at  Next PCP visit 2. Psychiatric Medications: none. 3. Behavioral recommendation(s):   -Consider self-care as priority -Consider reading educational material regarding coping with symptoms of depression and anxiety (and migraine headaches en espanol) -Consider [facility] as resource for herself and daughter (including Spanish counselors) SUBJECTIVE: Pt. referred by Dr [name] for symptoms of  depression:  Pt. reports the following symptoms/concerns: Pt says that she has been feeling more stress and depression lately, as her 13yo daughter has been displaying self-harm behavior (cutting); her daughter is seeing a therapist, but she still feels bad and worries about it,  knows she needs to take care of herself too, has been focusing on her daughter only, and realizes she needs to also focus on taking better care of her diabetes, so that she will be available for her daughter. Pt also has 43 year old married daughter.  Duration of problem: unknown Severity: moderate  OBJECTIVE: Orientation & Cognition: Oriented x3. Thought processes normal and appropriate to situation. Mood: teary. Affect: appropriate Appearance: appropriate Risk of harm to self or others: no risk of harm to self or others Substance use: none Assessments administered: PHQ9: 11/ GAD7: 8  Diagnosis: Anxiety and depression CPT Code: F41.8 -------------------------------------------- Other(s) present in the room: none  Time spent with patient in exam room: 20 minutes

## 2015-02-24 NOTE — Progress Notes (Signed)
Pt's here for HFU, for perinephric abscess.   Pt denies pain today, but reports feeling dizzy after starting antiobiotic since d/c from hospital.  Pt would like to esc.care with PCP.  Pt waited 10 mins after flu vaccine w/o adverse reaction to injection.

## 2015-02-24 NOTE — Progress Notes (Signed)
CC: Follow-up on perinephric abscess  HPI: Mandy Garza is a 44 y.o. female with a history of uncontrolled type 2 diabetes mellitus (A1c 16.6) hospitalized at [facility] from 02/14/15-02/17/15 for perinephric abscess.  She had presented to the ED with right flank pain and fever for 3 week after symptoms failed to resolve with outpatient treatment with Bactrim. Blood sugar on presentation was 600 and she was in DKA with an anion gap of 25 and bicarbonate of 17. WBC was elevated at 12.9 CT imaging revealed right perinephric/psoas fluid collection concern for abscess. She was started on IV zosyn. IR was consulted for image guided drain placement which was done on 02/15/15. Her blood culture was no growth to date and urine culture grew group B streptococcus (strept agalactiae) and so vancomycin was added to her regimen. For her diabetes, metformin was discontinued and she was placed on Lantus 50 units at bedtime along with NovoLog 5 units 3 times daily. She had repletion of her potassium, magnesium and her anion gap closed.  Her condition improved and she was subsequently switched to amoxicillin 500 mg 3 times daily to be continued for 3 weeks and to follow-up with interventional radiology.  Interval history: She complains of dizziness which she noticed once she started taking the antibiotics but denies abdominal pain, diarrhea or fever. Could not afford her insulin and so she resumed the metformin which she was previously taking. She was previously followed by a free clinic and [address] [facility].  No Known Allergies Past Medical History  Diagnosis Date  . Obesity   . Hypertension   . Diabetes mellitus without complication [facility])    Current Outpatient Prescriptions on File Prior to Visit  Medication Sig Dispense Refill  . amoxicillin (AMOXIL) 500 MG tablet Take 1 tablet (500 mg total) by mouth 3 (three) times daily. 63 tablet 0   No current facility-administered  medications on file prior to visit.   Family History  Problem Relation Age of Onset  . Diabetes Father   . Hypertension Father    Social History   Social History  . Marital Status: Divorced    Spouse Name: N/A  . Number of Children: N/A  . Years of Education: N/A   Occupational History  . Not on file.   Social History Main Topics  . Smoking status: Never Smoker   . Smokeless tobacco: Not on file  . Alcohol Use: No  . Drug Use: No  . Sexual Activity: Yes    Birth Control/ Protection: None   Other Topics Concern  . Not on file   Social History Narrative    Review of Systems: Constitutional: Negative for fever, chills, diaphoresis, activity change, appetite change and fatigue. HENT: Negative for ear pain, nosebleeds, congestion, facial swelling, rhinorrhea, neck pain, neck stiffness and ear discharge.  Eyes: Negative for pain, discharge, redness, itching and visual disturbance. Respiratory: Negative for cough, choking, chest tightness, shortness of breath, wheezing and stridor.  Cardiovascular: Negative for chest pain, palpitations and leg swelling. Gastrointestinal: Negative for abdominal distention. Genitourinary: Negative for dysuria, urgency, frequency, hematuria, flank pain, decreased urine volume, difficulty urinating and dyspareunia.  Musculoskeletal: Negative for back pain, joint swelling, arthralgias and gait problem. Neurological: positive for dizziness, negative tremors, seizures, syncope, facial asymmetry, speech difficulty, weakness, light-headedness, numbness and headaches.  Hematological: Negative for adenopathy. Does not bruise/bleed easily. Psychiatric/Behavioral: Negative for hallucinations, behavioral problems, confusion, dysphoric mood, decreased concentration and agitation.    Objective:   Filed Vitals:  02/24/15 1156  BP: 106/73  Pulse: 62  Temp: 97.8 F (36.6 C)  Resp: 18    Physical Exam: Constitutional: Patient appears obes,  well-developed and well-nourished. No distress. HENT: Normocephalic, atraumatic, External right and left ear normal. Oropharynx is clear and moist.  Eyes: Conjunctivae and EOM are normal. PERRLA, no scleral icterus. Neck: Normal ROM. Neck supple. No JVD. No tracheal deviation. No thyromegaly. CVS: RRR, S1/S2 +, no murmurs, no gallops, no carotid bruit.  Pulmonary: Effort and breath sounds normal, no stridor, rhonchi, wheezes, rales.  Abdominal: Soft. BS +,  no distension, tenderness, rebound or guarding, right flank drain in place with 25 mL of blood-tinged fluid  Musculoskeletal: Normal range of motion. No edema and no tenderness.  Lymphadenopathy: No lymphadenopathy noted, cervical, inguinal or axillary Neuro: Alert. Normal reflexes, muscle tone coordination. No cranial nerve deficit. Skin: Skin is warm and dry. No rash noted. Not diaphoretic. No erythema. No pallor. Psychiatric: Normal mood and affect. Behavior, judgment, thought content normal.  Lab Results  Component Value Date   WBC 4.8 02/17/2015   HGB 12.1 02/17/2015   HCT 36.4 02/17/2015   MCV 90.3 02/17/2015   PLT 277 02/17/2015   Lab Results  Component Value Date   CREATININE 0.61 02/17/2015   BUN 6 02/17/2015   NA 135 02/17/2015   K 3.6 02/17/2015   CL 103 02/17/2015   CO2 24 02/17/2015    Lab Results  Component Value Date   HGBA1C 16.6* 02/15/2015      Assessment and plan:  Perinephric abscess: Asymptomatic at this time. Currently on amoxicillin. I will send off a CBC today. Scheduled for abdominal imaging next week and also has an upcoming appointment with interventional radiology.  Type 2 diabetes mellitus:  Uncontrolled with A1c of 16.6.    She has been taking metformin and has not been using her insulin due to cost and so I have sent a prescription for Lantus and NovoLog for the pharmacy on site as this will be cost effective for her. I have reviewed her blood sugar log at her next visit.  This note  has been created with Education officer, environmentalDragon speech recognition software and smart phrase technology. Any transcriptional errors are unintentional.    Jaclyn ShaggyEnobong, Amao, MD. [facility] [phone] 02/24/2015, 12:43 PM

## 2015-02-24 NOTE — Patient Instructions (Signed)
Diabetes Mellitus and Food It is important for you to manage your blood sugar (glucose) level. Your blood glucose level can be greatly affected by what you eat. Eating healthier foods in the appropriate amounts throughout the day at about the same time each day will help you control your blood glucose level. It can also help slow or prevent worsening of your diabetes mellitus. Healthy eating may even help you improve the level of your blood pressure and reach or maintain a healthy weight.  General recommendations for healthful eating and cooking habits include:  Eating meals and snacks regularly. Avoid going long periods of time without eating to lose weight.  Eating a diet that consists mainly of plant-based foods, such as fruits, vegetables, nuts, legumes, and whole grains.  Using low-heat cooking methods, such as baking, instead of high-heat cooking methods, such as deep frying. Work with your dietitian to make sure you understand how to use the Nutrition Facts information on food labels. HOW CAN FOOD AFFECT ME? Carbohydrates Carbohydrates affect your blood glucose level more than any other type of food. Your dietitian will help you determine how many carbohydrates to eat at each meal and teach you how to count carbohydrates. Counting carbohydrates is important to keep your blood glucose at a healthy level, especially if you are using insulin or taking certain medicines for diabetes mellitus. Alcohol Alcohol can cause sudden decreases in blood glucose (hypoglycemia), especially if you use insulin or take certain medicines for diabetes mellitus. Hypoglycemia can be a life-threatening condition. Symptoms of hypoglycemia (sleepiness, dizziness, and disorientation) are similar to symptoms of having too much alcohol.  If your health care Magic Mohler has given you approval to drink alcohol, do so in moderation and use the following guidelines:  Women should not have more than one drink per day, and men  should not have more than two drinks per day. One drink is equal to:  12 oz of beer.  5 oz of wine.  1 oz of hard liquor.  Do not drink on an empty stomach.  Keep yourself hydrated. Have water, diet soda, or unsweetened iced tea.  Regular soda, juice, and other mixers might contain a lot of carbohydrates and should be counted. WHAT FOODS ARE NOT RECOMMENDED? As you make food choices, it is important to remember that all foods are not the same. Some foods have fewer nutrients per serving than other foods, even though they might have the same number of calories or carbohydrates. It is difficult to get your body what it needs when you eat foods with fewer nutrients. Examples of foods that you should avoid that are high in calories and carbohydrates but low in nutrients include:  Trans fats (most processed foods list trans fats on the Nutrition Facts label).  Regular soda.  Juice.  Candy.  Sweets, such as cake, pie, doughnuts, and cookies.  Fried foods. WHAT FOODS CAN I EAT? Eat nutrient-rich foods, which will nourish your body and keep you healthy. The food you should eat also will depend on several factors, including:  The calories you need.  The medicines you take.  Your weight.  Your blood glucose level.  Your blood pressure level.  Your cholesterol level. You should eat a variety of foods, including:  Protein.  Lean cuts of meat.  Proteins low in saturated fats, such as fish, egg whites, and beans. Avoid processed meats.  Fruits and vegetables.  Fruits and vegetables that may help control blood glucose levels, such as apples, mangoes, and   yams.  Dairy products.  Choose fat-free or low-fat dairy products, such as milk, yogurt, and cheese.  Grains, bread, pasta, and rice.  Choose whole grain products, such as multigrain bread, whole oats, and brown rice. These foods may help control blood pressure.  Fats.  Foods containing healthful fats, such as nuts,  avocado, olive oil, canola oil, and fish. DOES EVERYONE WITH DIABETES MELLITUS HAVE THE SAME MEAL PLAN? Because every person with diabetes mellitus is different, there is not one meal plan that works for everyone. It is very important that you meet with a dietitian who will help you create a meal plan that is just right for you.   This information is not intended to replace advice given to you by your health care Perina Salvaggio. Make sure you discuss any questions you have with your health care Nera Haworth.   Document Released: 01/18/2005 Document Revised: 05/14/2014 Document Reviewed: 03/20/2013 Elsevier Interactive Patient Education 2016 Elsevier Inc.  

## 2015-02-25 LAB — MICROALBUMIN / CREATININE URINE RATIO
Creatinine, Urine: 87 mg/dL (ref 20–320)
MICROALB/CREAT RATIO: 6 ug/mg{creat} (ref <30)
Microalb, Ur: 0.5 mg/dL

## 2015-03-01 NOTE — Telephone Encounter (Signed)
CMA called [employer] Paulina [phone]. Paulina left a message for the pt to return my call.

## 2015-03-01 NOTE — Telephone Encounter (Signed)
-----   Message from Jaclyn ShaggyEnobong Amao, MD sent at 02/25/2015 10:58 PM EDT ----- Mildly elevated potassium; will repeat at next visit.

## 2015-03-02 ENCOUNTER — Ambulatory Visit
Admission: RE | Admit: 2015-03-02 | Discharge: 2015-03-02 | Disposition: A | Discharge status: Home / Self Care | Payer: No Typology Code available for payment source | Source: Ambulatory Visit | Attending: Radiology | Admitting: Radiology

## 2015-03-02 DIAGNOSIS — N151 Renal and perinephric abscess: Secondary | ICD-10-CM

## 2015-03-02 DIAGNOSIS — L0291 Cutaneous abscess, unspecified: Secondary | ICD-10-CM

## 2015-03-02 MED ORDER — IOPAMIDOL (ISOVUE-300) INJECTION 61%
125.0000 mL | Freq: Once | INTRAVENOUS | Status: AC | PRN
  Administered 2015-03-02: 125 mL via INTRAVENOUS

## 2015-03-02 NOTE — Progress Notes (Signed)
Referring Physician(s): Dr Susie Cassette  Chief Complaint: The patient is seen in follow up today s/p  CT guided drainage of right retroperitoneal abscess yielding purulent fluid. A 12 French drain was placed and attached to suction bulb drainage 02/15/2015  History of present illness:  Pt presents today for follow up CT 2 weeks post drain placement. Doing well Denies pain Drainage is 2cc/day LD antibiotic yesterday Denies urinary symptoms but does say she is experiencing some vaginal itching Rec: seeing MD about possible yeast infection----possibly secondary antibiotibs  Past Medical History  Diagnosis Date  . Obesity   . Hypertension   . Diabetes mellitus without complication [facility])     Past Surgical History  Procedure Laterality Date  . Abdominal hysterectomy      Allergies: Review of patient's allergies indicates no known allergies.  Medications: Prior to Admission medications   Medication Sig Start Date End Date Taking? Authorizing Provider  amoxicillin (AMOXIL) 500 MG tablet Take 1 tablet (500 mg total) by mouth 3 (three) times daily. 02/17/15 03/10/15  Richarda Overlie, MD  insulin aspart (NOVOLOG) 100 UNIT/ML injection Inject 5 Units into the skin 3 (three) times daily with meals. 02/24/15   Jaclyn Shaggy, MD  insulin glargine (LANTUS) 100 UNIT/ML injection Inject 0.5 mLs (50 Units total) into the skin daily. 02/24/15   Jaclyn Shaggy, MD     Family History  Problem Relation Age of Onset  . Diabetes Father   . Hypertension Father     Social History   Social History  . Marital Status: Divorced    Spouse Name: N/A  . Number of Children: N/A  . Years of Education: N/A   Social History Main Topics  . Smoking status: Never Smoker   . Smokeless tobacco: Not on file  . Alcohol Use: No  . Drug Use: No  . Sexual Activity: Yes    Birth Control/ Protection: None   Other Topics Concern  . Not on file   Social History Narrative     Vital Signs: BP 98/69 mmHg   Pulse 64  Temp(Src) 98.5 F (36.9 C) (Oral)  SpO2 98%  Physical Exam  Abdominal: Soft.  Skin: Skin is warm and dry.  Skin site of abscess drain is clean and dry NT No bleeding or sign of infection Output in JP is serous; scant collection  Removal without issue New dressing placed     Imaging: No results found.  Labs:  CBC:  Recent Labs  02/15/15 0431 02/16/15 0528 02/17/15 0626 02/24/15 1241  WBC 10.3 6.0 4.8 7.3  HGB 12.6 11.6* 12.1 12.9  HCT 36.9 34.9* 36.4 39.5  PLT 297 259 277 356    COAGS:  Recent Labs  02/14/15 1641  INR 1.35    BMP:  Recent Labs  02/15/15 0725 02/15/15 1110 02/16/15 0528 02/17/15 0626 02/24/15 1241  NA 133* 133* 132* 135 136  K 3.3* 2.7* 4.2 3.6 5.4*  CL 99* 98* 100* 103 94*  CO2 20* 32*  GLUCOSE 137* 140* 350* 255* 215*  BUN 6 <5* CALCIUM 8.4* 8.3* 8.0* 7.8* 9.6  CREATININE 0.57 0.58 0.65 0.61 0.77  GFRNONAA >60 >60 >60 >60  --   GFRAA >60 >60 >60 >60  --     LIVER FUNCTION TESTS:  Recent Labs  02/15/15 1110 02/16/15 0528 02/17/15 0626 02/24/15 1241  BILITOT 0.5 0.6 0.4 0.4  AST 39*  ALT 12* 13* 13* 23  ALKPHOS 153*  159* 140* 129*  PROT 7.3 6.7 6.3* 7.6  ALBUMIN 2.0* 1.8* 1.8* 3.6    Assessment:  Rt perinephric area abscess resolved per CT 03/02/15- per Dr Harlene SaltsHoss Afeb; scant output Removal per Dr Bonnielee HaffHoss order  Signed: Gordan Grell A 03/02/2015, 11:19 AM   Please refer to Dr. Bonnielee HaffHoss attestation of this note for management and plan.

## 2015-03-10 ENCOUNTER — Ambulatory Visit: Payer: Self-pay | Attending: Family Medicine | Admitting: Family Medicine

## 2015-03-10 ENCOUNTER — Encounter: Payer: Self-pay | Admitting: Family Medicine

## 2015-03-10 VITALS — BP 123/84 | HR 60 | Temp 97.9°F | Resp 18 | Ht <= 58 in | Wt 240.0 lb

## 2015-03-10 DIAGNOSIS — N151 Renal and perinephric abscess: Secondary | ICD-10-CM

## 2015-03-10 DIAGNOSIS — Z6841 Body Mass Index (BMI) 40.0 and over, adult: Secondary | ICD-10-CM | POA: Insufficient documentation

## 2015-03-10 DIAGNOSIS — Z0000 Encounter for general adult medical examination without abnormal findings: Secondary | ICD-10-CM

## 2015-03-10 DIAGNOSIS — E669 Obesity, unspecified: Secondary | ICD-10-CM | POA: Insufficient documentation

## 2015-03-10 DIAGNOSIS — Z794 Long term (current) use of insulin: Secondary | ICD-10-CM | POA: Insufficient documentation

## 2015-03-10 DIAGNOSIS — E1165 Type 2 diabetes mellitus with hyperglycemia: Secondary | ICD-10-CM

## 2015-03-10 DIAGNOSIS — Z23 Encounter for immunization: Secondary | ICD-10-CM

## 2015-03-10 DIAGNOSIS — E875 Hyperkalemia: Secondary | ICD-10-CM | POA: Insufficient documentation

## 2015-03-10 DIAGNOSIS — I10 Essential (primary) hypertension: Secondary | ICD-10-CM | POA: Insufficient documentation

## 2015-03-10 LAB — COMPREHENSIVE METABOLIC PANEL
ALT: 19 U/L (ref 6–29)
AST: 20 U/L (ref 10–30)
Albumin: 3.9 g/dL (ref 3.6–5.1)
Alkaline Phosphatase: 121 U/L — ABNORMAL HIGH (ref 33–115)
BUN: 18 mg/dL (ref 7–25)
CHLORIDE: 100 mmol/L (ref 98–110)
CO2: 27 mmol/L (ref 20–31)
CREATININE: 0.7 mg/dL (ref 0.50–1.10)
Calcium: 9 mg/dL (ref 8.6–10.2)
GLUCOSE: 159 mg/dL — AB (ref 65–99)
POTASSIUM: 4.6 mmol/L (ref 3.5–5.3)
SODIUM: 138 mmol/L (ref 135–146)
Total Bilirubin: 0.3 mg/dL (ref 0.2–1.2)
Total Protein: 7.3 g/dL (ref 6.1–8.1)

## 2015-03-10 LAB — LIPID PANEL
CHOL/HDL RATIO: 6 ratio — AB (ref <=5.0)
Cholesterol: 306 mg/dL — ABNORMAL HIGH (ref 125–200)
HDL: 51 mg/dL (ref >=46)
LDL CALC: 196 mg/dL — AB (ref <130)
Triglycerides: 295 mg/dL — ABNORMAL HIGH (ref <150)
VLDL: 59 mg/dL — AB (ref <30)

## 2015-03-10 LAB — GLUCOSE, POCT (MANUAL RESULT ENTRY): POC GLUCOSE: 157 mg/dL — AB (ref 70–99)

## 2015-03-10 MED ORDER — INSULIN GLARGINE 100 UNIT/ML ~~LOC~~ SOLN: 55.0000 [IU] | Freq: Every day | SUBCUTANEOUS | Status: DC

## 2015-03-10 MED ORDER — PNEUMOCOCCAL VAC POLYVALENT 25 MCG/0.5ML IJ INJ: 0.5000 mL | INJECTION | INTRAMUSCULAR | Status: DC

## 2015-03-10 NOTE — Patient Instructions (Signed)
Diabetes Mellitus and Food It is important for you to manage your blood sugar (glucose) level. Your blood glucose level can be greatly affected by what you eat. Eating healthier foods in the appropriate amounts throughout the day at about the same time each day will help you control your blood glucose level. It can also help slow or prevent worsening of your diabetes mellitus. Healthy eating may even help you improve the level of your blood pressure and reach or maintain a healthy weight.  General recommendations for healthful eating and cooking habits include:  Eating meals and snacks regularly. Avoid going long periods of time without eating to lose weight.  Eating a diet that consists mainly of plant-based foods, such as fruits, vegetables, nuts, legumes, and whole grains.  Using low-heat cooking methods, such as baking, instead of high-heat cooking methods, such as deep frying. Work with your dietitian to make sure you understand how to use the Nutrition Facts information on food labels. HOW CAN FOOD AFFECT ME? Carbohydrates Carbohydrates affect your blood glucose level more than any other type of food. Your dietitian will help you determine how many carbohydrates to eat at each meal and teach you how to count carbohydrates. Counting carbohydrates is important to keep your blood glucose at a healthy level, especially if you are using insulin or taking certain medicines for diabetes mellitus. Alcohol Alcohol can cause sudden decreases in blood glucose (hypoglycemia), especially if you use insulin or take certain medicines for diabetes mellitus. Hypoglycemia can be a life-threatening condition. Symptoms of hypoglycemia (sleepiness, dizziness, and disorientation) are similar to symptoms of having too much alcohol.  If your health care provider has given you approval to drink alcohol, do so in moderation and use the following guidelines:  Women should not have more than one drink per day, and men  should not have more than two drinks per day. One drink is equal to:  12 oz of beer.  5 oz of wine.  1 oz of hard liquor.  Do not drink on an empty stomach.  Keep yourself hydrated. Have water, diet soda, or unsweetened iced tea.  Regular soda, juice, and other mixers might contain a lot of carbohydrates and should be counted. WHAT FOODS ARE NOT RECOMMENDED? As you make food choices, it is important to remember that all foods are not the same. Some foods have fewer nutrients per serving than other foods, even though they might have the same number of calories or carbohydrates. It is difficult to get your body what it needs when you eat foods with fewer nutrients. Examples of foods that you should avoid that are high in calories and carbohydrates but low in nutrients include:  Trans fats (most processed foods list trans fats on the Nutrition Facts label).  Regular soda.  Juice.  Candy.  Sweets, such as cake, pie, doughnuts, and cookies.  Fried foods. WHAT FOODS CAN I EAT? Eat nutrient-rich foods, which will nourish your body and keep you healthy. The food you should eat also will depend on several factors, including:  The calories you need.  The medicines you take.  Your weight.  Your blood glucose level.  Your blood pressure level.  Your cholesterol level. You should eat a variety of foods, including:  Protein.  Lean cuts of meat.  Proteins low in saturated fats, such as fish, egg whites, and beans. Avoid processed meats.  Fruits and vegetables.  Fruits and vegetables that may help control blood glucose levels, such as apples, mangoes, and   yams.  Dairy products.  Choose fat-free or low-fat dairy products, such as milk, yogurt, and cheese.  Grains, bread, pasta, and rice.  Choose whole grain products, such as multigrain bread, whole oats, and brown rice. These foods may help control blood pressure.  Fats.  Foods containing healthful fats, such as nuts,  avocado, olive oil, canola oil, and fish. DOES EVERYONE WITH DIABETES MELLITUS HAVE THE SAME MEAL PLAN? Because every person with diabetes mellitus is different, there is not one meal plan that works for everyone. It is very important that you meet with a dietitian who will help you create a meal plan that is just right for you.   This information is not intended to replace advice given to you by your health care provider. Make sure you discuss any questions you have with your health care provider.   Document Released: 01/18/2005 Document Revised: 05/14/2014 Document Reviewed: 03/20/2013 Elsevier Interactive Patient Education 2016 Elsevier Inc.  

## 2015-03-10 NOTE — Progress Notes (Signed)
Subjective:    Patient ID: Mandy Garza, female    DOB: 11/19/1970, 44 y.o.   MRN: [id number]  HPI 44 year old female with a history of uncontrolled type 2 diabetes mellitus (A1c of 16.6) recently hospitalized for a right perinephric abscess status post IR guided drainage with insertion of percutaneous drain and is currently on amoxicillin.  Seen by interventional radiology week ago and percutaneous drain was removed as the CT scan revealed resolution of right perinephric abscess.  She reports her fasting blood sugars at home have been in the 190 range and random sugars have been less than 200 at she is not here with her blood sugar log today. She has no complaints today.  Past Medical History  Diagnosis Date  . Obesity   . Hypertension   . Diabetes mellitus without complication [facility](HCC)     Past Surgical History  Procedure Laterality Date  . Abdominal hysterectomy      Social History   Social History  . Marital Status: Divorced    Spouse Name: N/A  . Number of Children: N/A  . Years of Education: N/A   Occupational History  . Not on file.   Social History Main Topics  . Smoking status: Never Smoker   . Smokeless tobacco: Not on file  . Alcohol Use: No  . Drug Use: No  . Sexual Activity: Yes    Birth Control/ Protection: None   Other Topics Concern  . Not on file   Social History Narrative    No Known Allergies  Current Outpatient Prescriptions on File Prior to Visit  Medication Sig Dispense Refill  . amoxicillin (AMOXIL) 500 MG tablet Take 1 tablet (500 mg total) by mouth 3 (three) times daily. 63 tablet 0  . insulin aspart (NOVOLOG) 100 UNIT/ML injection Inject 5 Units into the skin 3 (three) times daily with meals. 10 mL 3   No current facility-administered medications on file prior to visit.      Review of Systems  Constitutional: Negative for activity change and appetite change.  HENT: Negative for sinus pressure and sore throat.     Respiratory: Negative for chest tightness, shortness of breath and wheezing.   Gastrointestinal: Negative for abdominal pain, constipation and abdominal distention.  Genitourinary: Negative.   Musculoskeletal: Negative.   Psychiatric/Behavioral: Negative for behavioral problems and dysphoric mood.       Objective: Filed Vitals:   03/10/15 0911  BP: 123/84  Pulse: 60  Temp: 97.9 F (36.6 C)  TempSrc: Oral  Resp: 18  Height: 4\' 8"  (1.422 m)  Weight: 240 lb (108.863 kg)  SpO2: 94%      Physical Exam  Constitutional: She is oriented to person, place, and time. She appears well-developed and well-nourished.  Cardiovascular: Normal rate, normal heart sounds and intact distal pulses.   No murmur heard. Pulmonary/Chest: Effort normal and breath sounds normal. She has no wheezes. She has no rales. She exhibits no tenderness.  Abdominal: Soft. Bowel sounds are normal. She exhibits no distension and no mass. There is no tenderness.  Musculoskeletal: Normal range of motion.  Neurological: She is alert and oriented to person, place, and time.    CLINICAL DATA: Perinephric abscess  EXAM: CT ABDOMEN AND PELVIS WITH CONTRAST  TECHNIQUE: Multidetector CT imaging of the abdomen and pelvis was performed using the standard protocol following bolus administration of intravenous contrast.  CONTRAST: 125mL ISOVUE-300 IOPAMIDOL (ISOVUE-300) INJECTION 61%  COMPARISON: 02/14/2015  FINDINGS: An abscess strain has been placed and is coiled within  the location of the abscess cavity between the right kidney and psoas muscle. The abscess has completely resolved.  Stable right adrenal nodule.  Liver, spleen, pancreas, left adrenal gland, and left kidney are within normal limits.  Nephro graphic phase of the right kidney is within normal limits. There is some persistent perinephric stranding on the right extending in the retroperitoneum.  Normal appendix.  Uterus is  absent. Bladder and adnexa are within normal limits.  No new abscess. No free-fluid. No abnormal retroperitoneal adenopathy.  IMPRESSION: Right perinephric abscess has resolved.   Electronically Signed  By: Jolaine Click M.D.  On: 03/02/2015 11:39  CBC Latest Ref Rng 02/24/2015 02/17/2015 02/16/2015  WBC 4.0 - 10.5 K/uL 7.3 4.8 6.0  Hemoglobin 12.0 - 15.0 g/dL 16.1 09.6 11.6(L)  Hematocrit 36.0 - 46.0 % 39.5 36.4 34.9(L)  Platelets 150 - 400 K/uL 356 277 259   CMP Latest Ref Rng 02/24/2015 02/17/2015 02/16/2015  Glucose 65 - 99 mg/dL 045(W) 098(J) 191(Y)  BUN 7 - 25 mg/dL Creatinine 0.50 - 1.10 mg/dL 7.82 9.56 2.13  Sodium 135 - 146 mmol/L 136 135 132(L)  Potassium 3.5 - 5.3 mmol/L 5.4(H) 3.6 4.2  Chloride 98 - 110 mmol/L 94(L) 103 100(L)  CO2 20 - 31 mmol/L 32(H) 24 23  Calcium 8.6 - 10.2 mg/dL 9.6 0.8(M) 5.7(Q)  Total Protein 6.1 - 8.1 g/dL 7.6 6.3(L) 6.7  Total Bilirubin 0.2 - 1.2 mg/dL 0.4 0.4 0.6  Alkaline Phos 33 - 115 U/L 129(H) 140(H) 159(H)  AST 10 - 30 U/L 39(H) 25 21  ALT 6 - 29 U/L 23 13(L) 13(L)         Assessment & Plan:  Type 2 diabetes mellitus: Uncontrolled with A1c of 16.6, CBG 157 which is fasting No microalbumin in urine Increased Lantus from 50 units to 55 units at bedtime I'll hold off on ACE inhibitor due to hyperkalemia 5.4 Discussed annual eye exams; Pneumovax and foot exam today I will review her blood sugar log at the next visit.   Right perinephric abscess: Resolved and patient is status post removal of drain Advised to complete course of amoxicillin.  HCM: Pap smear not indicated as she is status post hysterectomy. Mammogram and complete physical at next office visit.  This note has been created with Education officer, environmental. Any transcriptional errors are unintentional.

## 2015-03-10 NOTE — Telephone Encounter (Signed)
Pt was seen in our office this morning and was given her lab results. Pt verbalized that she understood with no further questions.

## 2015-03-10 NOTE — Progress Notes (Signed)
Pt's here for f/up DM. Pt reports feeling well.  Pt didn't take her meds this morning.  Pt needs refills of her insulins.

## 2015-03-10 NOTE — Telephone Encounter (Signed)
-----   Message from Enobong Amao, MD sent at 02/25/2015 10:58 PM EDT ----- Mildly elevated potassium; will repeat at next visit. 

## 2015-03-11 ENCOUNTER — Other Ambulatory Visit: Payer: Self-pay | Admitting: Family Medicine

## 2015-03-11 DIAGNOSIS — E785 Hyperlipidemia, unspecified: Secondary | ICD-10-CM

## 2015-03-11 MED ORDER — ATORVASTATIN CALCIUM 40 MG PO TABS: 40.0000 mg | ORAL_TABLET | Freq: Every day | ORAL | Status: DC

## 2015-03-16 ENCOUNTER — Telehealth: Payer: Self-pay

## 2015-03-16 NOTE — Telephone Encounter (Signed)
-----   Message from Jaclyn ShaggyEnobong Amao, MD sent at 03/11/2015  1:20 PM EDT ----- Please inform her that her labs reveal elevated cholesterol and so I have sent a prescription for atorvastatin to the pharmacy.

## 2015-03-16 NOTE — Telephone Encounter (Signed)
CMA called [employer] interpreter and [facility][id number]. Interpreter left a message for patient to return my call asap.

## 2015-04-06 NOTE — Telephone Encounter (Signed)
Patient will be sent a letter at the address on file asking her to return my call.

## 2015-04-06 NOTE — Telephone Encounter (Deleted)
Patient was sent

## 2015-04-08 ENCOUNTER — Telehealth: Payer: Self-pay

## 2015-04-08 ENCOUNTER — Ambulatory Visit: Payer: Self-pay | Attending: Family Medicine | Admitting: Family Medicine

## 2015-04-08 ENCOUNTER — Encounter (HOSPITAL_BASED_OUTPATIENT_CLINIC_OR_DEPARTMENT_OTHER): Payer: Self-pay | Admitting: Clinical

## 2015-04-08 ENCOUNTER — Encounter: Payer: Self-pay | Admitting: Family Medicine

## 2015-04-08 VITALS — BP 124/80 | HR 72 | Temp 97.9°F | Resp 14 | Ht <= 58 in | Wt 250.0 lb

## 2015-04-08 DIAGNOSIS — F329 Major depressive disorder, single episode, unspecified: Secondary | ICD-10-CM | POA: Insufficient documentation

## 2015-04-08 DIAGNOSIS — E1165 Type 2 diabetes mellitus with hyperglycemia: Secondary | ICD-10-CM | POA: Insufficient documentation

## 2015-04-08 DIAGNOSIS — Z0000 Encounter for general adult medical examination without abnormal findings: Secondary | ICD-10-CM | POA: Insufficient documentation

## 2015-04-08 DIAGNOSIS — Z23 Encounter for immunization: Secondary | ICD-10-CM

## 2015-04-08 DIAGNOSIS — R748 Abnormal levels of other serum enzymes: Secondary | ICD-10-CM | POA: Insufficient documentation

## 2015-04-08 DIAGNOSIS — Z90710 Acquired absence of both cervix and uterus: Secondary | ICD-10-CM | POA: Insufficient documentation

## 2015-04-08 DIAGNOSIS — F419 Anxiety disorder, unspecified: Secondary | ICD-10-CM | POA: Insufficient documentation

## 2015-04-08 DIAGNOSIS — R945 Abnormal results of liver function studies: Secondary | ICD-10-CM | POA: Insufficient documentation

## 2015-04-08 DIAGNOSIS — Z79899 Other long term (current) drug therapy: Secondary | ICD-10-CM | POA: Insufficient documentation

## 2015-04-08 DIAGNOSIS — Z6841 Body Mass Index (BMI) 40.0 and over, adult: Secondary | ICD-10-CM | POA: Insufficient documentation

## 2015-04-08 DIAGNOSIS — F418 Other specified anxiety disorders: Secondary | ICD-10-CM

## 2015-04-08 DIAGNOSIS — E7800 Pure hypercholesterolemia, unspecified: Secondary | ICD-10-CM | POA: Insufficient documentation

## 2015-04-08 DIAGNOSIS — Z794 Long term (current) use of insulin: Secondary | ICD-10-CM | POA: Insufficient documentation

## 2015-04-08 DIAGNOSIS — E669 Obesity, unspecified: Secondary | ICD-10-CM | POA: Insufficient documentation

## 2015-04-08 LAB — COMPREHENSIVE METABOLIC PANEL
ALBUMIN: 4.1 g/dL (ref 3.6–5.1)
ALT: 25 U/L (ref 6–29)
AST: 27 U/L (ref 10–30)
Alkaline Phosphatase: 134 U/L — ABNORMAL HIGH (ref 33–115)
BILIRUBIN TOTAL: 0.5 mg/dL (ref 0.2–1.2)
BUN: 17 mg/dL (ref 7–25)
CO2: 29 mmol/L (ref 20–31)
CREATININE: 0.55 mg/dL (ref 0.50–1.10)
Calcium: 9.3 mg/dL (ref 8.6–10.2)
Chloride: 100 mmol/L (ref 98–110)
Glucose, Bld: 183 mg/dL — ABNORMAL HIGH (ref 65–99)
Potassium: 4.8 mmol/L (ref 3.5–5.3)
SODIUM: 141 mmol/L (ref 135–146)
Total Protein: 8.1 g/dL (ref 6.1–8.1)

## 2015-04-08 LAB — GLUCOSE, POCT (MANUAL RESULT ENTRY): POC Glucose: 184 mg/dl — AB (ref 70–99)

## 2015-04-08 MED ORDER — ATORVASTATIN CALCIUM 40 MG PO TABS: 40.0000 mg | ORAL_TABLET | Freq: Every day | ORAL | Status: DC

## 2015-04-08 MED ORDER — INSULIN GLARGINE 100 UNIT/ML ~~LOC~~ SOLN: 55.0000 [IU] | Freq: Every day | SUBCUTANEOUS | Status: DC

## 2015-04-08 MED ORDER — FLUCONAZOLE 150 MG PO TABS: 150.0000 mg | ORAL_TABLET | Freq: Once | ORAL | Status: DC

## 2015-04-08 MED ORDER — INSULIN ASPART 100 UNIT/ML ~~LOC~~ SOLN: 5.0000 [IU] | Freq: Three times a day (TID) | SUBCUTANEOUS | Status: DC

## 2015-04-08 NOTE — Progress Notes (Signed)
Subjective:  Patient ID: Mandy GirtNelli Garza, female    DOB: 09/18/1970  Age: 44 y.o. MRN: [id number]  CC: Annual Exam and Gynecologic Exam   HPI Mandy Garza presents for a complete physical exam. She is status post hysterectomy for fibroids and is unsure if she still has a cervix. Complains of intermittent burning on the left upper aspect of her breast with no breast lumps or breast pain for the last few days; thinks she has yeast infection. Scheduled to see the LCSW today for monitoring of depression and anxiety.  Outpatient Prescriptions Prior to Visit  Medication Sig Dispense Refill  . atorvastatin (LIPITOR) 40 MG tablet Take 1 tablet (40 mg total) by mouth daily. 30 tablet 2  . insulin aspart (NOVOLOG) 100 UNIT/ML injection Inject 5 Units into the skin 3 (three) times daily with meals. 10 mL 3  . insulin glargine (LANTUS) 100 UNIT/ML injection Inject 0.55 mLs (55 Units total) into the skin daily. 10 mL 3   No facility-administered medications prior to visit.    ROS Review of Systems  Constitutional: Negative for activity change, appetite change and fatigue.  HENT: Negative for congestion, sinus pressure and sore throat.   Eyes: Negative for visual disturbance.  Respiratory: Negative for cough, chest tightness, shortness of breath and wheezing.   Cardiovascular: Negative for chest pain and palpitations.  Gastrointestinal: Negative for abdominal pain, constipation and abdominal distention.  Endocrine: Negative for polydipsia.  Genitourinary: Positive for vaginal discharge. Negative for dysuria and frequency.  Musculoskeletal: Negative for back pain and arthralgias.  Skin: Negative for rash.       Burning sensation on the left breast  Neurological: Negative for tremors, light-headedness and numbness.  Hematological: Does not bruise/bleed easily.  Psychiatric/Behavioral: Negative for behavioral problems and agitation.    Objective:  BP 124/80 mmHg  Pulse 72   Temp(Src) 97.9 F (36.6 C)  Resp 14  Ht 4\' 8"  (1.422 m)  Wt 250 lb (113.399 kg)  BMI 56.08 kg/m2  SpO2 99%  BP/Weight 04/08/2015 03/10/2015 03/02/2015  Systolic BP 124 123 98  Diastolic BP 80 84 69  Wt. (Lbs) 250 240 -  BMI 56.08 53.84 -    Lab Results  Component Value Date   WBC 7.3 02/24/2015   HGB 12.9 02/24/2015   HCT 39.5 02/24/2015   PLT 356 02/24/2015   GLUCOSE 159* 03/10/2015   CHOL 306* 03/10/2015   TRIG 295* 03/10/2015   HDL 51 03/10/2015   LDLCALC 196* 03/10/2015   ALT 19 03/10/2015   AST 20 03/10/2015   NA 138 03/10/2015   K 4.6 03/10/2015   CL 100 03/10/2015   CREATININE 0.70 03/10/2015   BUN 18 03/10/2015   CO2 27 03/10/2015   INR 1.35 02/14/2015   HGBA1C 16.6* 02/15/2015   MICROALBUR 0.5 02/24/2015    Physical Exam  Constitutional: She is oriented to person, place, and time. No distress.  Morbidly obese  HENT:  Head: Normocephalic.  Right Ear: External ear normal.  Left Ear: External ear normal.  Nose: Nose normal.  Mouth/Throat: Oropharynx is clear and moist.  Eyes: Conjunctivae and EOM are normal. Pupils are equal, round, and reactive to light.  Neck: Normal range of motion. No JVD present.  Cardiovascular: Normal rate, regular rhythm, normal heart sounds and intact distal pulses.  Exam reveals no gallop.   No murmur heard. Pulmonary/Chest: Effort normal and breath sounds normal. No respiratory distress. She has no wheezes. She has no rales. She exhibits no tenderness.  Abdominal:  Soft. Bowel sounds are normal. She exhibits no distension and no mass. There is no tenderness.  Genitourinary: No breast swelling, tenderness, discharge or bleeding. There is no lesion on the right labia. There is no lesion on the left labia. No tenderness in the vagina. No vaginal discharge found.  Normal cervix, no cervical polyps or discharge.  Musculoskeletal: Normal range of motion. She exhibits no edema or tenderness.  Neurological: She is alert and oriented to  person, place, and time. She has normal reflexes.  Skin: Skin is warm and dry. She is not diaphoretic.  Psychiatric: She has a normal mood and affect.     Assessment & Plan:   1. Type 2 diabetes mellitus with hyperglycemia, unspecified long term insulin use status (HCC) Uncontrolled with A1c of 16.6. Hopefully there'll be some improvement next month due to currently improved blood sugars. - Glucose (CBG) - Comprehensive metabolic panel  2. Obesity Discussed exercise regimen, cutting portion sizes  3. Health care maintenance No rash visible on the left breast explain burning; patient has been given a form to schedule mammogram Treated presumptively for candidiasis and she complains of symptoms. Cervix tocolyse on exam she probably had a partial hysterectomy I will continue take Pap smears as per schedule. - Tdap vaccine greater than or equal to 7yo IM - HIV antibody (with reflex) - MM DIGITAL SCREENING BILATERAL; Future - Cytology - PAP [place]  4. Elevated liver enzymes Repeat compensative metabolic panel  5. Pure hypercholesterolemia She is yet to pick up her atorvastatin and has been advised to do so.   Meds ordered this encounter  Medications  . atorvastatin (LIPITOR) 40 MG tablet    Sig: Take 1 tablet (40 mg total) by mouth daily.    Dispense:  30 tablet    Refill:  2  . insulin aspart (NOVOLOG) 100 UNIT/ML injection    Sig: Inject 5 Units into the skin 3 (three) times daily with meals.    Dispense:  10 mL    Refill:  3  . insulin glargine (LANTUS) 100 UNIT/ML injection    Sig: Inject 0.55 mLs (55 Units total) into the skin daily.    Dispense:  10 mL    Refill:  3    Discontinue 50 units.  . fluconazole (DIFLUCAN) 150 MG tablet    Sig: Take 1 tablet (150 mg total) by mouth once.    Dispense:  1 tablet    Refill:  0    Follow-up: Return in about 6 weeks (around 05/20/2015) for Follow-up of diabetes mellitus.Jaclyn Shaggy MD

## 2015-04-08 NOTE — Progress Notes (Signed)
ASSESSMENT: Pt currently experiencing (reduction) in symptoms of anxiety and depression. Pt needs to keep up with her PCP appointments, and would benefit from continued supportive counseling regarding coping with symptoms of anxiety and depression.  Stage of Change: contemplative  PLAN: 1. F/U with behavioral health consultant in as needed 2. Psychiatric Medications: none. 3. Behavioral recommendation(s):   -Continue to remember importance of self-care as holidays approach -[facility], as needed  SUBJECTIVE: Pt. referred by Dr [name] for symptoms of anxiety and depression:  Pt. reports the following symptoms/concerns: Pt states that she has been feeling better since her last visit, [facility], but knows it is available for her and her family members; her 18yo daughter was feeling depressed too, but has been doing better; now that her daughter is doing better, pt feels she can now focus more on taking good care of herself.  Duration of problem: decreasing symptoms in past month Severity: moderate  OBJECTIVE: Orientation & Cognition: Oriented x3. Thought processes normal and appropriate to situation. Mood: appropriate. Affect: appropriate Appearance: appropriate Risk of harm to self or others: no known risk of harm to self or others Substance use: none Assessments administered: PHQ9: 10/ GAD7: 6  Diagnosis: Anxiety and depression CPT Code: F41.8 -------------------------------------------- Other(s) present in the room: none  Time spent with patient in exam room: 5 minutes

## 2015-04-08 NOTE — Patient Instructions (Addendum)
PLAN: 1. F/U with behavioral health consultant, Asher MuirJamie,  as needed  2. Behavioral recommendation(s):   -Continue to remember importance of self-care as holidays approach -[facility] for counseling, as needed

## 2015-04-08 NOTE — Telephone Encounter (Addendum)
CMA spoke with patient today she was given her lab results. I informed patient that a letter was sent out to her home. Patient informed me that recently she had a relative receiving her mail and he/she may have it with them.

## 2015-04-08 NOTE — Patient Instructions (Signed)
Obesity Obesity is defined as having too much total body fat and a body mass index (BMI) of 30 or more. BMI is an estimate of body fat and is calculated from your height and weight. BMI is typically calculated by your health care Armanie Ullmer during regular wellness visits. Obesity happens when you consume more calories than you can burn by exercising or performing daily physical tasks. Prolonged obesity can cause major illnesses or emergencies, such as:  Stroke.  Heart disease.  Diabetes.  Cancer.  Arthritis.  High blood pressure (hypertension).  High cholesterol.  Sleep apnea.  Erectile dysfunction.  Infertility problems. CAUSES   Regularly eating unhealthy foods.  Physical inactivity.  Certain disorders, such as an underactive thyroid (hypothyroidism), Cushing's syndrome, and polycystic ovarian syndrome.  Certain medicines, such as steroids, some depression medicines, and antipsychotics.  Genetics.  Lack of sleep. DIAGNOSIS A health care Angelina Neece can diagnose obesity after calculating your BMI. Obesity will be diagnosed if your BMI is 30 or higher. There are other methods of measuring obesity levels. Some other methods include measuring your skinfold thickness, your waist circumference, and comparing your hip circumference to your waist circumference. TREATMENT  A healthy treatment program includes some or all of the following:  Long-term dietary changes.  Exercise and physical activity.  Behavioral and lifestyle changes.  Medicine only under the supervision of your health care Jamond Neels. Medicines may help, but only if they are used with diet and exercise programs. If your BMI is 40 or higher, your health care Braxley Balandran may recommend specialized surgery or programs to help with weight loss. An unhealthy treatment program includes:  Fasting.  Fad diets.  Supplements and drugs. These choices do not succeed in long-term weight control. HOME CARE  INSTRUCTIONS  Exercise and perform physical activity as directed by your health care Zekiel Torian. To increase physical activity, try the following:  Use stairs instead of elevators.  Park farther away from store entrances.  Garden, bike, or walk instead of watching television or using the computer.  Eat healthy, low-calorie foods and drinks on a regular basis. Eat more fruits and vegetables. Use low-calorie cookbooks or take healthy cooking classes.  Limit fast food, sweets, and processed snack foods.  Eat smaller portions.  Keep a daily journal of everything you eat. There are many free websites to help you with this. It may be helpful to measure your foods so you can determine if you are eating the correct portion sizes.  Avoid drinking alcohol. Drink more water and drinks without calories.  Take vitamins and supplements only as recommended by your health care Andreana Klingerman.  Weight-loss support groups, registered dietitians, counselors, and stress reduction education can also be very helpful. SEEK IMMEDIATE MEDICAL CARE IF:  You have chest pain or tightness.  You have trouble breathing or feel short of breath.  You have weakness or leg numbness.  You feel confused or have trouble talking.  You have sudden changes in your vision.   This information is not intended to replace advice given to you by your health care Bre Pecina. Make sure you discuss any questions you have with your health care Gia Lusher.   Document Released: 05/31/2004 Document Revised: 05/14/2014 Document Reviewed: 05/30/2011 Elsevier Interactive Patient Education 2016 [employer] Inc.  

## 2015-04-08 NOTE — Progress Notes (Signed)
Patient here for annual exam,pap and breast exam She reports right breast pain She reports possible yeast infection She reports generalized "bone pain" She would like to be STD test as well as pap   She reports depression/anxiety for last six years since she separated from spouse Depression screen is 10 GAD7 is 6 She would like medication refills sent to our pharmacy

## 2015-04-11 LAB — HIV ANTIBODY (ROUTINE TESTING W REFLEX): HIV 1&2 Ab, 4th Generation: NONREACTIVE

## 2015-04-11 LAB — CYTOLOGY - PAP

## 2015-04-12 ENCOUNTER — Telehealth: Payer: Self-pay | Admitting: *Deleted

## 2015-04-12 NOTE — Telephone Encounter (Signed)
-----   Message from Jaclyn ShaggyEnobong Amao, MD sent at 04/12/2015  8:51 AM EST ----- Labs revealed normal Pap smear, negative HIV, negative for STDs

## 2015-04-12 NOTE — Telephone Encounter (Signed)
Left HIPAA compliant message for patient to return RN call at [phone] 

## 2015-04-13 ENCOUNTER — Encounter: Payer: Self-pay | Admitting: *Deleted

## 2015-04-13 NOTE — Telephone Encounter (Signed)
error 

## 2015-06-30 MED FILL — !NOVOLOG 100UNITS/ML VIAL: 100/ML | 28 days supply | Qty: 10 | Fill #1

## 2015-06-30 MED FILL — !LANTUS 100 UNITS/ML VIAL: 100 | 20 days supply | Qty: 10 | Fill #1

## 2015-07-13 ENCOUNTER — Ambulatory Visit: Payer: No Typology Code available for payment source | Admitting: Family Medicine

## 2015-09-29 ENCOUNTER — Inpatient Hospital Stay (HOSPITAL_COMMUNITY)
Admission: EM | Admit: 2015-09-29 | Discharge: 2015-10-04 | DRG: 872 | Disposition: A | Discharge status: Home / Self Care | Payer: Self-pay | Attending: Family Medicine | Admitting: Family Medicine

## 2015-09-29 ENCOUNTER — Emergency Department (HOSPITAL_COMMUNITY): Payer: Self-pay

## 2015-09-29 ENCOUNTER — Encounter (HOSPITAL_COMMUNITY): Payer: Self-pay | Admitting: Neurology

## 2015-09-29 DIAGNOSIS — Z833 Family history of diabetes mellitus: Secondary | ICD-10-CM

## 2015-09-29 DIAGNOSIS — N179 Acute kidney failure, unspecified: Secondary | ICD-10-CM | POA: Diagnosis present

## 2015-09-29 DIAGNOSIS — E876 Hypokalemia: Secondary | ICD-10-CM | POA: Diagnosis present

## 2015-09-29 DIAGNOSIS — B9620 Unspecified Escherichia coli [E. coli] as the cause of diseases classified elsewhere: Secondary | ICD-10-CM | POA: Diagnosis present

## 2015-09-29 DIAGNOSIS — B373 Candidiasis of vulva and vagina: Secondary | ICD-10-CM | POA: Diagnosis present

## 2015-09-29 DIAGNOSIS — D696 Thrombocytopenia, unspecified: Secondary | ICD-10-CM | POA: Diagnosis present

## 2015-09-29 DIAGNOSIS — R509 Fever, unspecified: Secondary | ICD-10-CM | POA: Diagnosis present

## 2015-09-29 DIAGNOSIS — Z794 Long term (current) use of insulin: Secondary | ICD-10-CM

## 2015-09-29 DIAGNOSIS — E871 Hypo-osmolality and hyponatremia: Secondary | ICD-10-CM | POA: Diagnosis present

## 2015-09-29 DIAGNOSIS — Z79899 Other long term (current) drug therapy: Secondary | ICD-10-CM

## 2015-09-29 DIAGNOSIS — Z8249 Family history of ischemic heart disease and other diseases of the circulatory system: Secondary | ICD-10-CM

## 2015-09-29 DIAGNOSIS — T383X6A Underdosing of insulin and oral hypoglycemic [antidiabetic] drugs, initial encounter: Secondary | ICD-10-CM | POA: Diagnosis present

## 2015-09-29 DIAGNOSIS — N12 Tubulo-interstitial nephritis, not specified as acute or chronic: Secondary | ICD-10-CM

## 2015-09-29 DIAGNOSIS — E878 Other disorders of electrolyte and fluid balance, not elsewhere classified: Secondary | ICD-10-CM | POA: Diagnosis present

## 2015-09-29 DIAGNOSIS — I959 Hypotension, unspecified: Secondary | ICD-10-CM | POA: Diagnosis present

## 2015-09-29 DIAGNOSIS — E785 Hyperlipidemia, unspecified: Secondary | ICD-10-CM | POA: Diagnosis present

## 2015-09-29 DIAGNOSIS — N10 Acute tubulo-interstitial nephritis: Secondary | ICD-10-CM | POA: Diagnosis present

## 2015-09-29 DIAGNOSIS — A419 Sepsis, unspecified organism: Principal | ICD-10-CM | POA: Diagnosis present

## 2015-09-29 DIAGNOSIS — I10 Essential (primary) hypertension: Secondary | ICD-10-CM | POA: Diagnosis present

## 2015-09-29 DIAGNOSIS — Z6841 Body Mass Index (BMI) 40.0 and over, adult: Secondary | ICD-10-CM

## 2015-09-29 DIAGNOSIS — Z91128 Patient's intentional underdosing of medication regimen for other reason: Secondary | ICD-10-CM

## 2015-09-29 DIAGNOSIS — E1165 Type 2 diabetes mellitus with hyperglycemia: Secondary | ICD-10-CM | POA: Diagnosis present

## 2015-09-29 DIAGNOSIS — R739 Hyperglycemia, unspecified: Secondary | ICD-10-CM | POA: Diagnosis present

## 2015-09-29 HISTORY — DX: Type 2 diabetes mellitus without complications: E11.9

## 2015-09-29 HISTORY — DX: Headache: R51

## 2015-09-29 HISTORY — DX: Headache, unspecified: R51.9

## 2015-09-29 HISTORY — DX: Pneumonia, unspecified organism: J18.9

## 2015-09-29 HISTORY — DX: Pure hypercholesterolemia, unspecified: E78.00

## 2015-09-29 LAB — URINALYSIS, ROUTINE W REFLEX MICROSCOPIC
BILIRUBIN URINE: NEGATIVE
GLUCOSE, UA: 500 mg/dL — AB
KETONES UR: 40 mg/dL — AB
Nitrite: NEGATIVE
PROTEIN: 100 mg/dL — AB
Specific Gravity, Urine: 1.027 (ref 1.005–1.030)
pH: 6 (ref 5.0–8.0)

## 2015-09-29 LAB — CBC WITH DIFFERENTIAL/PLATELET
Basophils Absolute: 0 10*3/uL (ref 0.0–0.1)
Basophils Relative: 0 %
EOS ABS: 0 10*3/uL (ref 0.0–0.7)
Eosinophils Relative: 0 %
HCT: 46.6 % — ABNORMAL HIGH (ref 36.0–46.0)
Hemoglobin: 15.7 g/dL — ABNORMAL HIGH (ref 12.0–15.0)
Lymphocytes Relative: 18 %
Lymphs Abs: 2.1 10*3/uL (ref 0.7–4.0)
MCH: 29.9 pg (ref 26.0–34.0)
MCHC: 33.7 g/dL (ref 30.0–36.0)
MCV: 88.8 fL (ref 78.0–100.0)
Monocytes Absolute: 0.8 10*3/uL (ref 0.1–1.0)
Monocytes Relative: 7 %
NEUTROS PCT: 75 %
Neutro Abs: 8.9 10*3/uL — ABNORMAL HIGH (ref 1.7–7.7)
PLATELETS: 157 10*3/uL (ref 150–400)
RBC: 5.25 MIL/uL — ABNORMAL HIGH (ref 3.87–5.11)
RDW: 12.8 % (ref 11.5–15.5)
WBC: 11.8 10*3/uL — AB (ref 4.0–10.5)

## 2015-09-29 LAB — COMPREHENSIVE METABOLIC PANEL
ALT: 42 U/L (ref 14–54)
ANION GAP: 16 — AB (ref 5–15)
AST: 65 U/L — ABNORMAL HIGH (ref 15–41)
Albumin: 3.5 g/dL (ref 3.5–5.0)
Alkaline Phosphatase: 166 U/L — ABNORMAL HIGH (ref 38–126)
BUN: 13 mg/dL (ref 6–20)
CHLORIDE: 96 mmol/L — AB (ref 101–111)
CO2: 22 mmol/L (ref 22–32)
CREATININE: 1.02 mg/dL — AB (ref 0.44–1.00)
Calcium: 9.2 mg/dL (ref 8.9–10.3)
GFR calc non Af Amer: >60 mL/min (ref >60)
Glucose, Bld: 431 mg/dL — ABNORMAL HIGH (ref 65–99)
Potassium: 3.6 mmol/L (ref 3.5–5.1)
SODIUM: 134 mmol/L — AB (ref 135–145)
Total Bilirubin: 0.8 mg/dL (ref 0.3–1.2)
Total Protein: 8.2 g/dL — ABNORMAL HIGH (ref 6.5–8.1)

## 2015-09-29 LAB — GLUCOSE, CAPILLARY
Glucose-Capillary: 364 mg/dL — ABNORMAL HIGH (ref 65–99)
Glucose-Capillary: 336 mg/dL — ABNORMAL HIGH (ref 65–99)

## 2015-09-29 LAB — CBC
HEMATOCRIT: 39.1 % (ref 36.0–46.0)
Hemoglobin: 13.2 g/dL (ref 12.0–15.0)
MCH: 29 pg (ref 26.0–34.0)
MCHC: 33.8 g/dL (ref 30.0–36.0)
MCV: 85.9 fL (ref 78.0–100.0)
Platelets: 129 10*3/uL — ABNORMAL LOW (ref 150–400)
RBC: 4.55 MIL/uL (ref 3.87–5.11)
RDW: 12.8 % (ref 11.5–15.5)
WBC: 8.6 10*3/uL (ref 4.0–10.5)

## 2015-09-29 LAB — CBG MONITORING, ED
GLUCOSE-CAPILLARY: 437 mg/dL — AB (ref 65–99)
Glucose-Capillary: 406 mg/dL — ABNORMAL HIGH (ref 65–99)
Glucose-Capillary: 425 mg/dL — ABNORMAL HIGH (ref 65–99)
Glucose-Capillary: 408 mg/dL — ABNORMAL HIGH (ref 65–99)

## 2015-09-29 LAB — CREATININE, SERUM
Creatinine, Ser: 1.19 mg/dL — ABNORMAL HIGH (ref 0.44–1.00)
GFR calc non Af Amer: 55 mL/min — ABNORMAL LOW (ref >60)

## 2015-09-29 LAB — URINE MICROSCOPIC-ADD ON

## 2015-09-29 LAB — LACTIC ACID, PLASMA
LACTIC ACID, VENOUS: 1.6 mmol/L (ref 0.5–2.0)
LACTIC ACID, VENOUS: 1.4 mmol/L (ref 0.5–2.0)

## 2015-09-29 LAB — MAGNESIUM: Magnesium: 1.8 mg/dL (ref 1.7–2.4)

## 2015-09-29 LAB — WET PREP, GENITAL
CLUE CELLS WET PREP: NONE SEEN
Sperm: NONE SEEN
TRICH WET PREP: NONE SEEN

## 2015-09-29 LAB — PHOSPHORUS: PHOSPHORUS: 1.8 mg/dL — AB (ref 2.5–4.6)

## 2015-09-29 LAB — TSH: TSH: 1.517 u[IU]/mL (ref 0.350–4.500)

## 2015-09-29 MED ORDER — ATORVASTATIN CALCIUM 40 MG PO TABS
40.0000 mg | ORAL_TABLET | Freq: Every day | ORAL | Status: DC
  Administered 2015-09-30 – 2015-10-04 (×5): 40 mg via ORAL
  Filled 2015-09-29 (×5): qty 1

## 2015-09-29 MED ORDER — DEXTROSE 5 % IV SOLN
1.0000 g | Freq: Once | INTRAVENOUS | Status: AC
  Administered 2015-09-29: 1 g via INTRAVENOUS
  Filled 2015-09-29: qty 10

## 2015-09-29 MED ORDER — VANCOMYCIN HCL 10 G IV SOLR
2000.0000 mg | Freq: Once | INTRAVENOUS | Status: AC
  Administered 2015-09-29: 2000 mg via INTRAVENOUS
  Filled 2015-09-29: qty 2000

## 2015-09-29 MED ORDER — INSULIN ASPART 100 UNIT/ML ~~LOC~~ SOLN
0.0000 [IU] | Freq: Three times a day (TID) | SUBCUTANEOUS | Status: DC
  Administered 2015-09-29: 20 [IU] via SUBCUTANEOUS
  Administered 2015-09-30: 7 [IU] via SUBCUTANEOUS
  Administered 2015-09-30: 15 [IU] via SUBCUTANEOUS
  Administered 2015-10-01 (×2): 11 [IU] via SUBCUTANEOUS
  Administered 2015-10-01: 15 [IU] via SUBCUTANEOUS
  Administered 2015-10-02 (×2): 11 [IU] via SUBCUTANEOUS
  Administered 2015-10-02 – 2015-10-03 (×2): 7 [IU] via SUBCUTANEOUS
  Administered 2015-10-03: 4 [IU] via SUBCUTANEOUS
  Administered 2015-10-03: 7 [IU] via SUBCUTANEOUS
  Administered 2015-10-04 (×2): 4 [IU] via SUBCUTANEOUS

## 2015-09-29 MED ORDER — INSULIN GLARGINE 100 UNIT/ML ~~LOC~~ SOLN
40.0000 [IU] | Freq: Every day | SUBCUTANEOUS | Status: DC
Start: 2015-09-29 — End: 2015-09-30
  Administered 2015-09-29: 40 [IU] via SUBCUTANEOUS
  Filled 2015-09-29 (×2): qty 0.4

## 2015-09-29 MED ORDER — PIPERACILLIN-TAZOBACTAM 3.375 G IVPB 30 MIN
3.3750 g | Freq: Once | INTRAVENOUS | Status: AC
  Administered 2015-09-29: 3.375 g via INTRAVENOUS
  Filled 2015-09-29: qty 50

## 2015-09-29 MED ORDER — ENOXAPARIN SODIUM 40 MG/0.4ML ~~LOC~~ SOLN
40.0000 mg | SUBCUTANEOUS | Status: DC
  Administered 2015-09-29: 40 mg via SUBCUTANEOUS
  Filled 2015-09-29: qty 0.4

## 2015-09-29 MED ORDER — K PHOS MONO-SOD PHOS DI & MONO 155-852-130 MG PO TABS
500.0000 mg | ORAL_TABLET | Freq: Three times a day (TID) | ORAL | Status: DC
  Administered 2015-09-29 – 2015-10-03 (×12): 500 mg via ORAL
  Filled 2015-09-29 (×15): qty 2

## 2015-09-29 MED ORDER — PIPERACILLIN-TAZOBACTAM 3.375 G IVPB
3.3750 g | Freq: Three times a day (TID) | INTRAVENOUS | Status: DC
  Administered 2015-09-30 – 2015-10-01 (×7): 3.375 g via INTRAVENOUS
  Filled 2015-09-29 (×13): qty 50

## 2015-09-29 MED ORDER — ACETAMINOPHEN 325 MG PO TABS
650.0000 mg | ORAL_TABLET | Freq: Once | ORAL | Status: AC
  Administered 2015-09-29: 650 mg via ORAL
  Filled 2015-09-29: qty 2

## 2015-09-29 MED ORDER — INSULIN ASPART 100 UNIT/ML ~~LOC~~ SOLN
10.0000 [IU] | Freq: Once | SUBCUTANEOUS | Status: AC
  Administered 2015-09-29: 10 [IU] via INTRAVENOUS
  Filled 2015-09-29: qty 1

## 2015-09-29 MED ORDER — FLUCONAZOLE 100 MG PO TABS: 150.0000 mg | ORAL_TABLET | Freq: Every day | ORAL | Status: DC

## 2015-09-29 MED ORDER — ADULT MULTIVITAMIN W/MINERALS CH
1.0000 | ORAL_TABLET | Freq: Every day | ORAL | Status: DC
  Administered 2015-09-29 – 2015-10-04 (×6): 1 via ORAL
  Filled 2015-09-29 (×6): qty 1

## 2015-09-29 MED ORDER — SODIUM CHLORIDE 0.9 % IV BOLUS (SEPSIS): 1000.0000 mL | Freq: Once | INTRAVENOUS | Status: DC

## 2015-09-29 MED ORDER — MORPHINE SULFATE (PF) 2 MG/ML IV SOLN
2.0000 mg | INTRAVENOUS | Status: DC | PRN
  Administered 2015-09-29: 2 mg via INTRAVENOUS
  Filled 2015-09-29: qty 1

## 2015-09-29 MED ORDER — SODIUM CHLORIDE 0.9 % IV BOLUS (SEPSIS)
1000.0000 mL | Freq: Once | INTRAVENOUS | Status: AC
  Administered 2015-09-29: 1000 mL via INTRAVENOUS

## 2015-09-29 MED ORDER — ONDANSETRON HCL 4 MG/2ML IJ SOLN: 4.0000 mg | Freq: Four times a day (QID) | INTRAMUSCULAR | Status: DC | PRN

## 2015-09-29 MED ORDER — FLUCONAZOLE 100 MG PO TABS
150.0000 mg | ORAL_TABLET | Freq: Once | ORAL | Status: AC
  Administered 2015-09-29: 150 mg via ORAL
  Filled 2015-09-29: qty 2

## 2015-09-29 MED ORDER — SODIUM CHLORIDE 0.9 % IV SOLN
INTRAVENOUS | Status: AC
  Administered 2015-09-29: 1000 mL via INTRAVENOUS

## 2015-09-29 MED ORDER — ONDANSETRON HCL 4 MG PO TABS: 4.0000 mg | ORAL_TABLET | Freq: Four times a day (QID) | ORAL | Status: DC | PRN

## 2015-09-29 MED ORDER — DEXTROSE 5 % IV SOLN: 1.0000 g | INTRAVENOUS | Status: DC

## 2015-09-29 MED ORDER — VANCOMYCIN HCL IN DEXTROSE 750-5 MG/150ML-% IV SOLN
750.0000 mg | Freq: Two times a day (BID) | INTRAVENOUS | Status: DC
  Administered 2015-09-30: 750 mg via INTRAVENOUS
  Filled 2015-09-29 (×2): qty 150

## 2015-09-29 MED ORDER — POLYETHYLENE GLYCOL 3350 17 G PO PACK: 17.0000 g | PACK | Freq: Every day | ORAL | Status: DC | PRN

## 2015-09-29 MED ORDER — ENSURE ENLIVE PO LIQD
237.0000 mL | Freq: Two times a day (BID) | ORAL | Status: DC
  Administered 2015-09-30 – 2015-10-04 (×6): 237 mL via ORAL

## 2015-09-29 MED ORDER — INSULIN ASPART 100 UNIT/ML ~~LOC~~ SOLN
0.0000 [IU] | Freq: Every day | SUBCUTANEOUS | Status: DC
  Administered 2015-09-29: 4 [IU] via SUBCUTANEOUS
  Administered 2015-09-30 – 2015-10-01 (×2): 3 [IU] via SUBCUTANEOUS

## 2015-09-29 MED ORDER — SENNA 8.6 MG PO TABS
1.0000 | ORAL_TABLET | Freq: Two times a day (BID) | ORAL | Status: DC
  Administered 2015-09-29: 8.6 mg via ORAL
  Filled 2015-09-29: qty 1

## 2015-09-29 MED ORDER — DOCUSATE SODIUM 100 MG PO CAPS
100.0000 mg | ORAL_CAPSULE | Freq: Two times a day (BID) | ORAL | Status: DC
  Administered 2015-09-29: 100 mg via ORAL
  Filled 2015-09-29: qty 1

## 2015-09-29 NOTE — Progress Notes (Signed)
Pharmacy Antibiotic Note  Mandy Garza is a 45 y.o. female admitted on 09/29/2015 with N/V and inability to eat. Patient has possible pyelo. Pharmacy has been consulted for ceftriaxone dosing. Patient reports frequency of urination. Was given one dose of ceftriaxone in the ED today. Tmax of 102.2, WBC 11.8. UA showed many bacteria and moderate leukocytes. Also with many WBC's and no epithelial cells.  Now MD would like to switch to vancomycin and Zosyn for sepsis. Scr stable at 1.02, normalized CrCl ~70-80 ml/min.  Plan: Stop ceftriaxone Give vancomycin 2g IV x 1, then start vancomycin 750mg  IV Q12 Give Zosyn 3.375g IV (30min infusion) x 1, then start Zosyn 3.375 gm IV q8h (4 hour infusion)      Temp (24hrs), Avg:100.9 F (38.3 C), Min:98 F (36.7 C), Max:102.5 F (39.2 C)   Recent Labs Lab 09/29/15 1051 09/29/15 1445  WBC 11.8*  --   CREATININE 1.02*  --   LATICACIDVEN  --  1.6    CrCl cannot be calculated (Unknown ideal weight.).    No Known Allergies  Antimicrobials this admission: Ceftriaxone 5/25 >> 5/25 Zosyn 5/25 >>  Vancomycin 5/25 >>  Microbiology results: 5/25 BCx: sent 5/25 UCx: sent  Thank you for allowing pharmacy to be a part of this patient's care.  Armandina StammerBATCHELDER,Tranice Laduke J 09/29/2015 4:13 PM

## 2015-09-29 NOTE — ED Notes (Signed)
Pt reports n/v since Monday and hasn't been able to eat, took her sugar at home and it was in the 500's. Hasn't been taking her insulin like she is supposed to because of cost. Reports hx of same after having a sore to her back but now is healed.

## 2015-09-29 NOTE — H&P (Signed)
Family Medicine Teaching [facility] Admission History and Physical Service Pager: [phone]  Patient name: Mandy Garza Medical record number: [id number] Date of birth: 10-23-1970 Age: 45 y.o. Gender: female  Primary Care Provider: Jaclyn Shaggy, MD Consultants: None Code Status: FULL  Chief Complaint: Pain in right lower back   Assessment and Plan: Mandy Garza is a 45 y.o. female presenting with right flank pain, urinary symptoms, fevers and night sweats  . PMH is significant for poorly controlled DM2, HTN. HLD and pyelonephritis with pyelonephric abscess.   #UTI and pyelonephritis / Sepsis: Pyuria, many bacteria in urine, fever meeting criteria for pyelonephritis. Also meets sepsis criteria given tachycardic to 121, hypotensive to 94/50, elevated temperature to 102.5 and leukocytosis to 11.8 with left shift. Sofa score of 0:  S/P CTX and NS Bolus 1,000 mL in ER. Lactic acidosis wnl at 1.6.   - Admit to [facility] Attending Dr. McDiarmid. - VSS per protocol - 30cc/kg bolus given, reassess at 6 hours.  - Trending lactic acid. 1.6 initially - UA and culture collected (Prior to Abx)  - Collected wet prep and GC, Chlamydia - Bcx collected (after 1 dose of CTX in ED) - IV Vancomycin and Zosyn for empiric coverage given hx of perinephric abscess with Group B strep and sepsis. Dosing per pharmacy. Will narrow Abx once urine cultures are resulted.  - Concern for recurrent abscess. CT abdomen.  - Tylenol for pain & fever - Morphine 2 mg Q4 PRN for pain - IVF @ 125 mL/hr - CBC - BMET - I/Os  #Vaginal yeast infection:  Yeast seen on wet prep. Likely secondary to uncontrolled DM.  - Fluconazole 150 mg single dose given   #Hyperglycemia in the setting of DM2:  Uncontrolled at baseline. A1c16.6 on 02/2015. Home dose: Lantus 55 units QD and Novolog 5 units.  Per patient report typically 300-400 at home but have been 500s since Monday. She has continued to take her insulin  although she had not been eating. CBGs of 408-425 in the ED.  - Lantus 40 units and resistant SSI - Monitor CBGs - A1c, Phosph, Mg & TSH ordered -Anion gap of 16, in the setting of hypochloremia. Monitor closely given prior admission for DKA  #HTN: Hypotensive to 94/50 in the ED. - Hold home BP meds - mIVF @ 125 mL/hr  #HDL: - On home dose of atorvastatin 40 mg   #AKI: Cr of 1.02. Likely 2/2 dehydration.  - mIVF @ 125 mL/hr - Daily BMET  FEN/GI: mIVF and HH/Carb modified diet  Prophylaxis: Lovenox  Disposition: pending tx   History of Present Illness:  Mandy Garza is a 45 y.o. female presenting with urinary frequency, urgency, incomplete voiding, chills, rigors, night sweats, subjective fevers, and right flank pain since Monday. She denies dysuria or hematuria. Her right flank pain is localized and specifically does not radiate to groin. Her pain is constant and not intermittent and is a 7/10. Endorses some SOB, but denies CP. She also endorses decreased PO intake since Monday. Ate once last night and had vomiting but no other nausea or emesis. Had only held down one bottle of juice since Monday. She has only tried aspirin for pain/fevers and states it provided minor relief. Has not taken any other medications.   She states that her blood sugars have been elevated to 500s since Monday (normally 300-400). She has continued to take her insulin despite not eating. She had continued to take her home medicines until last night. Did not take  anything today.  Upon further questioning, she admits to red/brown vaginal discharge but denies any pain and specifically denies any new sexual partners. Denies any previous STIs/bacterial infections but endorses previous yeast infection.  Additional, she denies a personal history of nephrolithiasis but her father has hx of kidney stones.  Of note, she had previously been admitted with DKA. She thinks September of 2016.   Review Of Systems: Per HPI  with the following additions:   Denies HA, other pains besides right flank pain, repetitive vomiting, diarrhea.  Otherwise the remainder of the systems were negative.  Patient Active Problem List   Diagnosis Date Noted  . Hyperglycemia 09/29/2015  . Obesity 04/08/2015  . Elevated liver enzymes 04/08/2015  . Pure hypercholesterolemia 04/08/2015  . Hyperlipidemia 03/11/2015  . Perinephric abscess 02/14/2015  . Poorly controlled diabetes mellitus (HCC) 02/14/2015  . DKA (diabetic ketoacidoses) (HCC) 02/14/2015  . Hyponatremia 02/14/2015  . Essential hypertension 02/14/2015    Past Medical History: Past Medical History  Diagnosis Date  . Obesity   . Hypertension   . Diabetes mellitus without complication [facility](HCC)     Past Surgical History: Past Surgical History  Procedure Laterality Date  . Abdominal hysterectomy      Social History: Social History  Substance Use Topics  . Smoking status: Never Smoker   . Smokeless tobacco: None  . Alcohol Use: No   Additional social history: Denies smoking, EtOH or other illicit drugs.  Denies new sexual partners and previous STIs.  Please also refer to relevant sections of EMR.  Family History: Family History  Problem Relation Age of Onset  . Diabetes Father   . Hypertension Father     Allergies and Medications: No Known Allergies No current facility-administered medications on file prior to encounter.   Current Outpatient Prescriptions on File Prior to Encounter  Medication Sig Dispense Refill  . atorvastatin (LIPITOR) 40 MG tablet Take 1 tablet (40 mg total) by mouth daily. 30 tablet 2  . insulin aspart (NOVOLOG) 100 UNIT/ML injection Inject 5 Units into the skin 3 (three) times daily with meals. 10 mL 3  . insulin glargine (LANTUS) 100 UNIT/ML injection Inject 0.55 mLs (55 Units total) into the skin daily. 10 mL 3  . fluconazole (DIFLUCAN) 150 MG tablet Take 1 tablet (150 mg total) by mouth once. (Patient not taking: Reported on  09/29/2015) 1 tablet 0    Objective: BP 124/65 mmHg  Pulse 121  Temp(Src) 102.2 F (39 C) (Oral)  Resp 15  SpO2 95%   Exam: General: Morbidly obese. Lying in bed. Sweating profusely.  Eyes: PEEARL.  ENTM: Mucous membranes slightly dry. Tonsils are non-edematous, non-erythematous and without exudate. No cervical LDA.  Neck: Supple.  Cardiovascular: Tachycardic. Rrr. Slight outflow murmur on left sternal border. No gallops, S3 or S4 appreciated.  Respiratory: CTA bilaterally. No increased WOB. No wheezes ronchi or rales.  Abdomen: Obese. NTTP. Difficult to palpate for organomegaly.  GU/Pelvic: +CVA tenderness on R. Negative CVA on left. Strong vaginal odor. Did not complete speculum exam however on pelvic exam, no discharge was present. Collected wet prep, GC & Chlamydia.   Extremities: +2 DP pulses bilaterally. Several small nodules on shins of both legs.   Neuro: 5/5 strength throughout. Sensation intact.  Psych: Pleasant. Full affect.   Labs and Imaging: CBC BMET   Recent Labs Lab 09/29/15 1051  WBC 11.8*  HGB 15.7*  HCT 46.6*  PLT 157    Recent Labs Lab 09/29/15 1051  NA 134*  K 3.6  CL 96*  CO2 22  BUN 13  CREATININE 1.02*  GLUCOSE 431*  CALCIUM 9.2     Results for orders placed or performed during the hospital encounter of 09/29/15  Wet prep, genital     Status: Abnormal   Collection Time: 09/29/15  3:51 PM  Result Value Ref Range Status   Yeast Wet Prep HPF POC PRESENT (A) NONE SEEN Final   Trich, Wet Prep NONE SEEN NONE SEEN Final   Clue Cells Wet Prep HPF POC NONE SEEN NONE SEEN Final   WBC, Wet Prep HPF POC FEW (A) NONE SEEN Final   Sperm NONE SEEN  Final     Crissie Reese, Med Student 09/29/2015, 3:55 PM MS4, [facility] FPTS Intern pager: [phone], text pages welcome   I agree with the above evaluation, assessment, and plan. Any correctional changes can be noted in [facility].   Yolande Jolly, MD Family Medicine Resident - PGY  2

## 2015-09-29 NOTE — ED Provider Notes (Signed)
CSN: [id number]     Arrival date & time 09/29/15  1033 History   First MD Initiated Contact with Patient 09/29/15 1037     Chief Complaint  Patient presents with  . Nausea     (Consider location/radiation/quality/duration/timing/severity/associated sxs/prior Treatment) HPI Comments: Patient in the ED with daughter with complaint of high blood sugar and nausea. She generally feels ill with chills, body aches but denies dysuria, cough, diarrhea. She has not felt well for 2-3 days.  She is tolerating oral fluids without vomiting. She has a history of insulin dependent diabetes but reports she cannot afford her regularly prescribed insulin so she tries to spread it out to make it last longer. Per daughter, her blood sugars are usually 300-400.  The history is provided by the patient and a relative. No language interpreter was used.    Past Medical History  Diagnosis Date  . Obesity   . Hypertension   . Diabetes mellitus without complication [facility](HCC)    Past Surgical History  Procedure Laterality Date  . Abdominal hysterectomy     Family History  Problem Relation Age of Onset  . Diabetes Father   . Hypertension Father    Social History  Substance Use Topics  . Smoking status: Never Smoker   . Smokeless tobacco: None  . Alcohol Use: No   OB History    No data available     Review of Systems  Constitutional: Positive for chills and fatigue. Negative for fever.  Eyes: Negative for visual disturbance.  Respiratory: Negative.  Negative for cough and shortness of breath.   Cardiovascular: Negative.  Negative for chest pain and palpitations.  Gastrointestinal: Positive for nausea. Negative for vomiting, abdominal pain and diarrhea.  Genitourinary: Positive for frequency. Negative for dysuria and flank pain.  Musculoskeletal: Positive for myalgias.  Neurological: Positive for weakness. Negative for syncope.      Allergies  Review of patient's allergies indicates no known  allergies.  Home Medications   Prior to Admission medications   Medication Sig Start Date End Date Taking? Authorizing Provider  atorvastatin (LIPITOR) 40 MG tablet Take 1 tablet (40 mg total) by mouth daily. 04/08/15   Jaclyn ShaggyEnobong Amao, MD  fluconazole (DIFLUCAN) 150 MG tablet Take 1 tablet (150 mg total) by mouth once. 04/08/15   Jaclyn ShaggyEnobong Amao, MD  insulin aspart (NOVOLOG) 100 UNIT/ML injection Inject 5 Units into the skin 3 (three) times daily with meals. 04/08/15   Jaclyn ShaggyEnobong Amao, MD  insulin glargine (LANTUS) 100 UNIT/ML injection Inject 0.55 mLs (55 Units total) into the skin daily. 04/08/15   Jaclyn ShaggyEnobong Amao, MD   BP 115/81 mmHg  Pulse 111  Temp(Src) 98 F (36.7 C) (Oral)  Resp 15  SpO2 96% Physical Exam  Constitutional: She is oriented to person, place, and time. She appears well-developed and well-nourished.  HENT:  Head: Normocephalic.  Neck: Normal range of motion. Neck supple.  Cardiovascular: Regular rhythm.  Tachycardia present.   No murmur heard. Pulmonary/Chest: Effort normal and breath sounds normal. She has no wheezes. She has no rales. She exhibits no tenderness.  Abdominal: Soft. Bowel sounds are normal. There is no tenderness. There is no rebound and no guarding.  Genitourinary:  Right CVA tenderness.   Musculoskeletal: Normal range of motion.  Neurological: She is alert and oriented to person, place, and time. Coordination normal.  Skin: Skin is warm and dry. No rash noted.  Psychiatric: She has a normal mood and affect.    ED Course  Procedures (including critical  care time) Labs Review Labs Reviewed  CBC WITH DIFFERENTIAL/PLATELET - Abnormal; Notable for the following:    RBC 5.25 (*)    Hemoglobin 15.7 (*)    HCT 46.6 (*)    All other components within normal limits  CBG MONITORING, ED - Abnormal; Notable for the following:    Glucose-Capillary 406 (*)    All other components within normal limits  COMPREHENSIVE METABOLIC PANEL  URINALYSIS, ROUTINE W REFLEX  MICROSCOPIC (NOT AT [facility])    Imaging Review No results found. I have personally reviewed and evaluated these images and lab results as part of my medical decision-making.   EKG Interpretation None      MDM   Final diagnoses:  None    1. Right pyelonephritis  The patient presents with concern for elevated blood sugar. She reports trying to spread her insulin out over time to conserve what she had and that her blood sugar consistently runs in the 300-400 range. Has become ill with symptoms of nausea, vomiting, chills, and general malaise, prompting ED evaluation.   The patient is given 2 liters fluid with no reduction in heart rate or blood sugar. She is found to have a significant urinary infection, together with right flank pain indicating pyelonephritis. On recheck she has an elevated temperature of 102.5. Given diabetic status in setting of pyelonephritis, will admit for further treatment of infection and possible coordination of care regarding more accessible insulin treatment for home use for better glycemic control.    Elpidio Anis, PA-C 09/29/15 1430  Gwyneth Sprout, MD 09/29/15 [phone]

## 2015-09-29 NOTE — Progress Notes (Signed)
Per MD order patient will be transferred in 2C08. RN was called for report.

## 2015-09-29 NOTE — Progress Notes (Signed)
Received report from Maralyn SagoSarah in ED

## 2015-09-29 NOTE — Progress Notes (Signed)
Pharmacy Antibiotic Note  Julien Girtelli Vecchio is a 45 y.o. female admitted on 09/29/2015 with N/V and inability to eat. Patient has possible pyelo. Pharmacy has been consulted for ceftriaxone dosing. Patient reports frequency of urination. Was given one dose of ceftriaxone in the ED today. Tmax of 102.2, WBC 11.8. UA showed many bacteria and moderate leukocytes. Also with many WBC's and no epithelial cells.  Plan: Continue ceftriaxone 1g IV Q24 Monitor clinical picture F/U C&S, abx deescalation / LOT     Temp (24hrs), Avg:100.9 F (38.3 C), Min:98 F (36.7 C), Max:102.5 F (39.2 C)   Recent Labs Lab 09/29/15 1051 09/29/15 1445  WBC 11.8*  --   CREATININE 1.02*  --   LATICACIDVEN  --  1.6    CrCl cannot be calculated (Unknown ideal weight.).    No Known Allergies  Antimicrobials this admission: Ceftriaxone 5/25 >>   Microbiology results: 5/25 BCx: sent 5/25 UCx: sent  Thank you for allowing pharmacy to be a part of this patient's care.  Armandina StammerBATCHELDER,Kampbell Holaway J 09/29/2015 4:07 PM

## 2015-09-29 NOTE — Progress Notes (Signed)
Patient trasfered from ED to [phone] via stretcher; alert and oriented x 4; complaints of pain in lower right side of back; IV  in LFA  Running fluids@75cc /hr; skin intact. Orient patient to room and unit; gave patient care guide; instructed how to use the call bell and  fall risk precautions. Will continue to monitor the patient.

## 2015-09-30 ENCOUNTER — Inpatient Hospital Stay (HOSPITAL_COMMUNITY): Payer: Self-pay

## 2015-09-30 DIAGNOSIS — N179 Acute kidney failure, unspecified: Secondary | ICD-10-CM | POA: Diagnosis present

## 2015-09-30 DIAGNOSIS — E1165 Type 2 diabetes mellitus with hyperglycemia: Secondary | ICD-10-CM

## 2015-09-30 LAB — GLUCOSE, CAPILLARY
GLUCOSE-CAPILLARY: 313 mg/dL — AB (ref 65–99)
GLUCOSE-CAPILLARY: 241 mg/dL — AB (ref 65–99)
Glucose-Capillary: 401 mg/dL — ABNORMAL HIGH (ref 65–99)
Glucose-Capillary: 277 mg/dL — ABNORMAL HIGH (ref 65–99)

## 2015-09-30 LAB — BASIC METABOLIC PANEL
Anion gap: 11 (ref 5–15)
BUN: 13 mg/dL (ref 6–20)
CHLORIDE: 104 mmol/L (ref 101–111)
CO2: 20 mmol/L — AB (ref 22–32)
Calcium: 7.9 mg/dL — ABNORMAL LOW (ref 8.9–10.3)
Creatinine, Ser: 1.22 mg/dL — ABNORMAL HIGH (ref 0.44–1.00)
GFR calc Af Amer: >60 mL/min (ref >60)
GFR calc non Af Amer: 53 mL/min — ABNORMAL LOW (ref >60)
GLUCOSE: 303 mg/dL — AB (ref 65–99)
POTASSIUM: 3.7 mmol/L (ref 3.5–5.1)
Sodium: 135 mmol/L (ref 135–145)

## 2015-09-30 LAB — CBC
HEMATOCRIT: 43 % (ref 36.0–46.0)
Hemoglobin: 14.5 g/dL (ref 12.0–15.0)
MCH: 29.4 pg (ref 26.0–34.0)
MCHC: 33.7 g/dL (ref 30.0–36.0)
MCV: 87.2 fL (ref 78.0–100.0)
Platelets: 131 10*3/uL — ABNORMAL LOW (ref 150–400)
RBC: 4.93 MIL/uL (ref 3.87–5.11)
RDW: 13 % (ref 11.5–15.5)
WBC: 11.1 10*3/uL — AB (ref 4.0–10.5)

## 2015-09-30 LAB — PHOSPHORUS: Phosphorus: 1.9 mg/dL — ABNORMAL LOW (ref 2.5–4.6)

## 2015-09-30 LAB — HEMOGLOBIN A1C
HEMOGLOBIN A1C: 15.3 % — AB (ref 4.8–5.6)
Mean Plasma Glucose: 392 mg/dL

## 2015-09-30 LAB — GC/CHLAMYDIA PROBE AMP (~~LOC~~) NOT AT ARMC
CHLAMYDIA, DNA PROBE: NEGATIVE
NEISSERIA GONORRHEA: NEGATIVE

## 2015-09-30 LAB — MRSA PCR SCREENING: MRSA by PCR: NEGATIVE

## 2015-09-30 MED ORDER — DIATRIZOATE MEGLUMINE & SODIUM 66-10 % PO SOLN
ORAL | Status: AC
  Administered 2015-09-30: 03:00:00
  Filled 2015-09-30: qty 30

## 2015-09-30 MED ORDER — ACETAMINOPHEN 325 MG PO TABS
650.0000 mg | ORAL_TABLET | ORAL | Status: DC | PRN
  Administered 2015-09-30 – 2015-10-03 (×10): 650 mg via ORAL
  Filled 2015-09-30 (×9): qty 2

## 2015-09-30 MED ORDER — ENOXAPARIN SODIUM 60 MG/0.6ML ~~LOC~~ SOLN
55.0000 mg | SUBCUTANEOUS | Status: DC
  Administered 2015-09-30: 55 mg via SUBCUTANEOUS
  Filled 2015-09-30: qty 0.55

## 2015-09-30 MED ORDER — SENNA 8.6 MG PO TABS: 1.0000 | ORAL_TABLET | Freq: Every day | ORAL | Status: DC | PRN

## 2015-09-30 MED ORDER — INSULIN NPH (HUMAN) (ISOPHANE) 100 UNIT/ML ~~LOC~~ SUSP
20.0000 [IU] | Freq: Two times a day (BID) | SUBCUTANEOUS | Status: DC
  Administered 2015-09-30 – 2015-10-01 (×3): 20 [IU] via SUBCUTANEOUS
  Filled 2015-09-30 (×2): qty 10

## 2015-09-30 MED ORDER — ACETAMINOPHEN 325 MG PO TABS
650.0000 mg | ORAL_TABLET | Freq: Four times a day (QID) | ORAL | Status: DC | PRN
  Administered 2015-09-30: 650 mg via ORAL
  Filled 2015-09-30 (×2): qty 2

## 2015-09-30 MED ORDER — INSULIN ASPART 100 UNIT/ML ~~LOC~~ SOLN
20.0000 [IU] | Freq: Once | SUBCUTANEOUS | Status: AC
  Administered 2015-09-30: 20 [IU] via SUBCUTANEOUS

## 2015-09-30 MED ORDER — SODIUM CHLORIDE 0.9 % IV SOLN
INTRAVENOUS | Status: AC
  Administered 2015-09-30 – 2015-10-01 (×2): via INTRAVENOUS

## 2015-09-30 MED ORDER — SODIUM CHLORIDE 0.9 % IV SOLN
8.0000 mg | Freq: Four times a day (QID) | INTRAVENOUS | Status: DC | PRN
  Administered 2015-09-30 – 2015-10-01 (×3): 8 mg via INTRAVENOUS
  Filled 2015-09-30 (×7): qty 4

## 2015-09-30 MED ORDER — IOPAMIDOL (ISOVUE-300) INJECTION 61%
INTRAVENOUS | Status: AC
  Administered 2015-09-30: 100 mL
  Filled 2015-09-30: qty 100

## 2015-09-30 MED ORDER — MAGNESIUM SULFATE 2 GM/50ML IV SOLN
2.0000 g | Freq: Once | INTRAVENOUS | Status: AC
  Administered 2015-09-30: 2 g via INTRAVENOUS
  Filled 2015-09-30: qty 50

## 2015-09-30 NOTE — Progress Notes (Signed)
Family Medicine Teaching Service Daily Progress Note Intern Pager: [phone]  Patient name: Mandy Garza Medical record number: [id number] Date of birth: February 16, 1971 Age: 45 y.o. Gender: female  Primary Care Provider: Jaclyn Shaggy, MD Consultants: None Code Status: Full (per discussion on admission)  Pt Overview and Major Events to Date:  5/25: Admit to FPTS, tx for sepsis  Assessment and Plan: Mandy Garza is a 45 y.o. female presenting with right flank pain, urinary symptoms, fevers and night sweats . PMH is significant for poorly controlled DM2, HTN. HLD and pyelonephritis with pyelonephric abscess.   Pyelonephritis / Sepsis:  Remains tachycardic but BP now normotensive. Febrile overnight to 103, afebrile this morning. Sofa score of 0. CT abdomen showing "Findings are suggestive for pyelonephritis. No evidence for a renal abscess or perinephric abscess. Stable right adrenal nodule. This nodule remains indeterminate. This could be more definitively characterized with a non emergent MRI." - UA and culture collected (Prior to Abx)  - Bcx collected (after 1 dose of CTX in ED) - GC, Chlamydia pending - DC IV Vancomycin, continue Zosyn. Will narrow abx when cultures return.  - Concern for recurrent abscess. CT abdomen.  - Tylenol for pain & fever - Morphine 2 mg Q4 PRN for pain (hasn't used overnight or this morning) - IVF @ 125 mL/hr - AM CBC, BMETs - I/Os: UO: (750cc since admission)  Vaginal yeast infection: Yeast seen on wet prep. Likely secondary to uncontrolled DM. s/p Fluconazole 150 mg single dose given.  Hyperglycemia in the setting of DM2: Uncontrolled at baseline. A1c16.6 on 02/2015, A1C 15.3 on 5/25. Home dose: Lantus 55 units QD and Novolog 5 units but does not take due to cost. Per patient report typically 300-400 at home but have been 500s since Monday. She has continued to take her insulin although she had not been eating. CBGs of 408-425 in the ED. Anion  gap of 11, normal electrolytes.  - Will change from Lantus to NPH as this will be more affordable for patient in outpatient setting.   - Start with NPH 20 units BID  - Resistant SSI - Monitor CBGs   Hypophosphatemia and Magnesium: Phos 1.8 on admission. Mag 1.8 yesterday.  - Kphos 500 mg TID - Re-check on 5/27 - Keep Mag >2, will give another dose today  HTN: Hypotensive to 94/50 in the ED. Normotensive now - Hold home BP meds - mIVF @ 125 mL/hr  HDL: - On home dose of atorvastatin 40 mg  AKI: Cr of 1.02. Likely 2/2 dehydration. Cr increased to 1.22 - mIVF @ 125 mL/hr - Daily BMET  Prolonged QTc: QTc of 480 of telemetry. EKGs showing QTc of 660 (innacurate) - Continue on tele - Will be cautious with QT prolonging medications  FEN/GI: mIVF and HH/Carb modified diet  Prophylaxis: Lovenox  Disposition: SDU  Subjective:  Patient has no complaints this morning. She states she feels a lot better this morning compared to last night when she was feeling febrile. Endorses some mild right back pain. Still no appetite. Is able to drink water.   Objective: Temp:  [98 F (36.7 C)-103 F (39.4 C)] 103 F (39.4 C) (05/26 0505) Pulse Rate:  [96-121] 115 (05/26 0000) Resp:  [8-20] 8 (05/26 0000) BP: (94-135)/(45-82) 125/60 mmHg (05/26 0400) SpO2:  [91 %-96 %] 96 % (05/26 0400) Weight:  [251 lb 5.2 oz (114 kg)-257 lb 4.4 oz (116.7 kg)] 257 lb 4.4 oz (116.7 kg) (05/25 2000) Physical Exam: General: Morbidly obese. Lying in bed.  Sweating profusely.  Cardiovascular: Tachycardic. Rrr. Slight outflow murmur on left sternal border. No gallops, S3 or S4 appreciated.  Respiratory: CTA bilaterally. No increased WOB. No wheezes ronchi or rales.  Abdomen: Obese. NTTP. Difficult to palpate for organomegaly.  MDK: +CVA tenderness on R. Negative CVA on left.  Psych: Pleasant. Full affect.   Laboratory:  Recent Labs Lab 09/29/15 1051 09/29/15 1744 09/30/15 0311  WBC 11.8* 8.6 11.1*   HGB 15.7* 13.2 14.5  HCT 46.6* 39.1 43.0  PLT 157 129* 131*    Recent Labs Lab 09/29/15 1051 09/29/15 1744 09/30/15 0311  NA 134*  --  135  K 3.6  --  3.7  CL 96*  --  104  CO2 22  --  20*  BUN 13  --  13  CREATININE 1.02* 1.19* 1.22*  CALCIUM 9.2  --  7.9*  PROT 8.2*  --   --   BILITOT 0.8  --   --   ALKPHOS 166*  --   --   ALT 42  --   --   AST 65*  --   --   GLUCOSE 431*  --  303*     Imaging/Diagnostic Tests: Dg Chest 2 View  09/29/2015  CLINICAL DATA:  Cough and chest pain today EXAM: CHEST  2 VIEW COMPARISON:  None. FINDINGS: Cardiac shadow is mildly enlarged in size. The lungs are well aerated with minimal right basilar atelectasis. No focal confluent infiltrate is seen. No bony abnormality is noted. IMPRESSION: Minimal right basilar atelectasis. Electronically Signed   By: Alcide CleverMark  Lukens M.D.   On: 09/29/2015 12:47     Beaulah Dinninghristina M Lidia Clavijo, MD 09/30/2015, 6:49 AM PGY-1, [facility] FPTS Intern pager: [phone], text pages welcome

## 2015-09-30 NOTE — Progress Notes (Signed)
Nutrition Brief Note  Patient identified on the Malnutrition Screening Tool (MST) Report.  Wt Readings from Last 15 Encounters:  09/29/15 257 lb 4.4 oz (116.7 kg)  04/08/15 250 lb (113.399 kg)  03/10/15 240 lb (108.863 kg)  02/24/15 232 lb (105.235 kg)  02/16/15 234 lb 9.1 oz (106.4 kg)    Body mass index is 57.71 kg/(m^2). Patient meets criteria for Obesity Class III based on current BMI.   Current diet order is Heart Healthy/Carbohydrate Modified.  Labs and medications reviewed.   No nutrition interventions warranted at this time. If nutrition issues arise, please consult RD.   Maureen ChattersKatie Talecia Sherlin, RD, LDN Pager #: [phone] After-Hours Pager #: [phone]

## 2015-09-30 NOTE — Progress Notes (Signed)
Inpatient Diabetes Program Recommendations  AACE/ADA: New Consensus Statement on Inpatient Glycemic Control (2015)  Target Ranges:  Prepandial:   less than 140 mg/dL      Peak postprandial:   less than 180 mg/dL (1-2 hours)      Critically ill patients:  140 - 180 mg/dL  Results for Mandy Garza, [name] (MRN [id number]) as of 09/30/2015 11:07  Ref. Range 02/17/2015 07:41 02/17/2015 12:20 02/17/2015 16:45 09/29/2015 10:57 09/29/2015 13:06 09/29/2015 14:26 09/29/2015 15:46 09/29/2015 17:32 09/29/2015 22:18 09/30/2015 08:43  Glucose-Capillary Latest Ref Range: 65-99 mg/dL 409224 (H) 811273 (H) 914316 (H) 406 (H) 425 (H) 437 (H) 408 (H) 364 (H) 336 (H) 313 (H)   Review of Glycemic Control  Diabetes history: DM 2 Outpatient Diabetes medications: Lantus 55 units q hs + Novolog 5 tid with meals Current orders for Inpatient glycemic control: Lantus 40 units q d + Novolog correction 0-20 tid + 0-5 hs  Inpatient Diabetes Program Recommendations:  Noted continued elevated CBGs. Please consider increase in Lantus to 50 units qhs + Novolog 5 units meal coverage tid (hold if eats < 50%).  Thank you, Billy FischerJudy E. Valeta Paz, RN, MSN, CDE Inpatient Glycemic Control Team Team Pager [phone] (8am-5pm) 09/30/2015 11:16 AM

## 2015-10-01 DIAGNOSIS — D696 Thrombocytopenia, unspecified: Secondary | ICD-10-CM | POA: Insufficient documentation

## 2015-10-01 LAB — GLUCOSE, CAPILLARY
GLUCOSE-CAPILLARY: 280 mg/dL — AB (ref 65–99)
GLUCOSE-CAPILLARY: 284 mg/dL — AB (ref 65–99)
Glucose-Capillary: 340 mg/dL — ABNORMAL HIGH (ref 65–99)
Glucose-Capillary: 279 mg/dL — ABNORMAL HIGH (ref 65–99)

## 2015-10-01 LAB — PHOSPHORUS
Phosphorus: 2.2 mg/dL — ABNORMAL LOW (ref 2.5–4.6)
Phosphorus: 1.4 mg/dL — ABNORMAL LOW (ref 2.5–4.6)

## 2015-10-01 LAB — CBC
HEMATOCRIT: 38.9 % (ref 36.0–46.0)
HEMOGLOBIN: 13.1 g/dL (ref 12.0–15.0)
MCH: 28.8 pg (ref 26.0–34.0)
MCHC: 33.7 g/dL (ref 30.0–36.0)
MCV: 85.5 fL (ref 78.0–100.0)
Platelets: 94 10*3/uL — ABNORMAL LOW (ref 150–400)
RBC: 4.55 MIL/uL (ref 3.87–5.11)
RDW: 13.3 % (ref 11.5–15.5)
WBC: 6.8 10*3/uL (ref 4.0–10.5)

## 2015-10-01 LAB — RENAL FUNCTION PANEL
ALBUMIN: 2.4 g/dL — AB (ref 3.5–5.0)
Anion gap: 13 (ref 5–15)
BUN: 10 mg/dL (ref 6–20)
CHLORIDE: 101 mmol/L (ref 101–111)
CO2: 20 mmol/L — ABNORMAL LOW (ref 22–32)
CREATININE: 1.07 mg/dL — AB (ref 0.44–1.00)
Calcium: 7.4 mg/dL — ABNORMAL LOW (ref 8.9–10.3)
GFR calc Af Amer: >60 mL/min (ref >60)
GLUCOSE: 309 mg/dL — AB (ref 65–99)
PHOSPHORUS: 1.9 mg/dL — AB (ref 2.5–4.6)
POTASSIUM: 2.5 mmol/L — AB (ref 3.5–5.1)
Sodium: 134 mmol/L — ABNORMAL LOW (ref 135–145)

## 2015-10-01 LAB — MAGNESIUM: Magnesium: 2.5 mg/dL — ABNORMAL HIGH (ref 1.7–2.4)

## 2015-10-01 MED ORDER — POTASSIUM CHLORIDE CRYS ER 20 MEQ PO TBCR
40.0000 meq | EXTENDED_RELEASE_TABLET | Freq: Two times a day (BID) | ORAL | Status: AC
  Administered 2015-10-01 (×2): 40 meq via ORAL
  Filled 2015-10-01 (×2): qty 2

## 2015-10-01 MED ORDER — SODIUM CHLORIDE 0.9 % IV SOLN
INTRAVENOUS | Status: DC
  Administered 2015-10-01 – 2015-10-02 (×3): via INTRAVENOUS

## 2015-10-01 MED ORDER — POTASSIUM CHLORIDE CRYS ER 20 MEQ PO TBCR: 40.0000 meq | EXTENDED_RELEASE_TABLET | Freq: Two times a day (BID) | ORAL | Status: DC

## 2015-10-01 NOTE — Progress Notes (Signed)
Patient transferring to 5W26. Called report to receiving RN and answered all questions. Patient's family at bedside and made aware of patient's transfer.

## 2015-10-01 NOTE — Progress Notes (Signed)
Pharmacy Antibiotic Note  Mandy Garza is a 45 y.o. female admitted on 09/29/2015 with N/V and inability to eat. Patient is on Zosyn monotherapy for pyelonephritis. Vancomycin was d/c'ed yesterday. UCx with E.coli pending sensitivities    Plan: Give Zosyn 3.375g IV (30min infusion) x 1, then start Zosyn 3.375 gm IV q8h (4 hour infusion) Pharmacy to sign off since no further dose adjustments necessary. Will monitor peripherally till UCx sensitivities result    Height: 4\' 8"  (142.2 cm) Weight: 256 lb 6.3 oz (116.3 kg) IBW/kg (Calculated) : 36.3  Temp (24hrs), Avg:101.2 F (38.4 C), Min:97.7 F (36.5 C), Max:103 F (39.4 C)   Recent Labs Lab 09/29/15 1051 09/29/15 1445 09/29/15 1744 09/30/15 0311 10/01/15 0344  WBC 11.8*  --  8.6 11.1*  --   CREATININE 1.02*  --  1.19* 1.22* 1.07*  LATICACIDVEN  --  1.6 1.4  --   --     Estimated Creatinine Clearance: 73.1 mL/min (by C-G formula based on Cr of 1.07).    No Known Allergies  Antimicrobials this admission: Ceftriaxone 5/25 >> 5/25 Zosyn 5/25 >>  Vancomycin 5/25 >>  Microbiology results: 5/25 BCx: NGTD 5/25 UCx: E.Coli  Thank you for allowing pharmacy to be a part of this patient's care.  Vinnie LevelBenjamin Uilani Sanville, PharmD., BCPS Clinical Pharmacist Pager [phone]

## 2015-10-01 NOTE — Progress Notes (Signed)
Family Medicine Teaching Service Daily Progress Note Intern Pager: [phone]  Patient name: Mandy Garza Medical record number: [id number] Date of birth: February 17, 1971 Age: 45 y.o. Gender: female  Primary Care Provider: Jaclyn Shaggy, MD Consultants: None Code Status: Full (per discussion on admission)  Pt Overview and Major Events to Date:  5/25: Admit to FPTS, tx for sepsis  Assessment and Plan: Mandy Garza is a 45 y.o. female presenting with right flank pain, urinary symptoms, fevers and night sweats . PMH is significant for poorly controlled DM2, HTN. HLD and pyelonephritis with pyelonephric abscess.   Pyelonephritis / Sepsis:  Tachycardia resolved. Normotensive this am.  Febrile overnight to 102.70F. CT abdomen showing "Findings are suggestive for pyelonephritis. No evidence for a renal abscess or perinephric abscess. Stable right adrenal nodule. This nodule remains indeterminate. This could be more definitively characterized with a non emergent MRI." - UA and culture collected (Prior to Abx): 100K GNR - Bcx collected (after 1 dose of CTX in ED): NG x2 days - GC, Chlamydia both NEGATIVE - DC IV Vancomycin, continue Zosyn (5/25>). - Will narrow abx when cultures return.  - Tylenol for pain & fever - Morphine 2 mg Q4 PRN for pain, not used in > 24 hours - IVF @ 125 mL/hr, continue until taking good PO - AM CBC, BMETs - I/Os: UOP not measured.  Has had 4 unmeasured in last 24 hours.  About 807 cc intake.  Vaginal yeast infection: Yeast seen on wet prep. Likely secondary to uncontrolled DM. s/p Fluconazole 150 mg single dose given.  Hyperglycemia in the setting of DM2: Uncontrolled at baseline. A1c16.6 on 02/2015, A1C 15.3 on 5/25. Home dose: Lantus 55 units QD and Novolog 5 units but does not take due to cost.  BG 277-401/ 24 hours.  Has used 65 units total.   - NPH 20 units BID.  Patient continues to have nausea and vomiting.  Only had NPH x1 yesterday.  Will watch BG  this am.  If continues to be high, consider increasing to 25 units NPH BID. - Resistant SSI - Monitor CBGs   Hypophosphatemia, hypokalemia and Magnesium: Phos 1.8>1.9. Mag 1.8>am level pending.  - Kphos 500 mg TID - Keep Mag >2 - KDur 40 meq x2 - Check mag - am RFP  HTN: Normotensive  - Hold home BP meds - mIVF @ 125 mL/hr  HDL: - On home dose of atorvastatin 40 mg  AKI: improving. Cr 1.22> 1.07 this am. Likely 2/2 dehydration.  - mIVF @ 125 mL/hr - Daily BMET  Prolonged QTc: QTc of 480 of telemetry. EKGs showing QTc of 660 (innacurate) - Continue on tele - Will be cautious with QT prolonging medications - Repeat EKG  FEN/GI: mIVF and HH/Carb modified diet  Prophylaxis: Lovenox  Disposition: SDU  Subjective:  Patient reports some nausea with vomiting last evening.  Doing well this am.  She reports continued intermittent fevers, chills.  Back pain improving.  Still little appetite.  Overall, feeling somewhat better though.  Objective: Temp:  [97.7 F (36.5 C)-103 F (39.4 C)] 100 F (37.8 C) (05/27 0400) Pulse Rate:  [87-104] 91 (05/27 0400) Resp:  [13-32] 24 (05/27 0400) BP: (108-145)/(60-107) 124/68 mmHg (05/27 0400) SpO2:  [91 %-100 %] 95 % (05/27 0400) Weight:  [256 lb 6.3 oz (116.3 kg)] 256 lb 6.3 oz (116.3 kg) (05/27 0307) Physical Exam: General: Morbidly obese. Lying in bed. NAD Cardiovascular: RRR. Slight outflow murmur on left sternal border. No gallops, S3 or S4 appreciated.  Respiratory: CTAB. No increased WOB. No wheezes ronchi or rales.  Abdomen: Obese. NTTP. +BS MDK: +CVA tenderness on R (improving). Negative CVA on left.  Psych: Pleasant. Full affect. Mood stable.  Laboratory:  Recent Labs Lab 09/29/15 1051 09/29/15 1744 09/30/15 0311  WBC 11.8* 8.6 11.1*  HGB 15.7* 13.2 14.5  HCT 46.6* 39.1 43.0  PLT 157 129* 131*    Recent Labs Lab 09/29/15 1051 09/29/15 1744 09/30/15 0311 10/01/15 0344  NA 134*  --  135 134*  K 3.6  --   3.7 2.5*  CL 96*  --  104 101  CO2 22  --  20* 20*  BUN 13  --  13 10  CREATININE 1.02* 1.19* 1.22* 1.07*  CALCIUM 9.2  --  7.9* 7.4*  PROT 8.2*  --   --   --   BILITOT 0.8  --   --   --   ALKPHOS 166*  --   --   --   ALT 42  --   --   --   AST 65*  --   --   --   GLUCOSE 431*  --  303* 309*     Imaging/Diagnostic Tests: Ct Abdomen Pelvis W Contrast  09/30/2015  CLINICAL DATA:  45 year old with pyelonephritis. EXAM: CT ABDOMEN AND PELVIS WITH CONTRAST TECHNIQUE: Multidetector CT imaging of the abdomen and pelvis was performed using the standard protocol following bolus administration of intravenous contrast. CONTRAST:  ISOVUE-300 IOPAMIDOL (ISOVUE-300) INJECTION 61% COMPARISON:  03/02/2015 FINDINGS: Lower chest: Few patchy densities at the lung bases are suggestive for atelectasis. No pleural effusions. Hepatobiliary: Normal appearance of the liver, gallbladder and portal venous system. Pancreas: Normal appearance of the pancreas without inflammation or duct dilatation. Spleen: Normal appearance of spleen without enlargement. Adrenals/Urinary Tract: Stable 1.9 cm right adrenal nodule. Postcontrast Hounsfield units are 58 and this nodule remains indeterminate. This nodule was indeterminate on a previous noncontrast study. Normal appearance of the left adrenal gland. Striated enhancement pattern throughout both kidneys, best seen on the delayed renal images. Mild perinephric stranding bilaterally, left side greater than right. Negative for hydronephrosis. Urinary bladder is decompressed. No definite bladder or ureter stones. Mild stranding around the mid left ureter may be related to the adjacent perinephric stranding. Previously, there was a drainage catheter in the right psoas muscle for an abscess collection. This drain has been removed. There is a very small low-density area between the right kidney and the right psoas muscle that probably represents changes from the prior abscess. There is  no evidence for a new abscess collection in this area. Stomach/Bowel: Normal appearance of stomach and duodenum. No significant small bowel dilatation. The right colon and cecum are slightly dilated but no evidence for obstruction. Moderate amount of stool within the left colon and sigmoid colon. Vascular/Lymphatic: No evidence for an aortic aneurysm. No suspicious lymphadenopathy within the abdomen or pelvis. Again noted are small retroperitoneal lymph nodes. Reproductive: Uterus has been removed. Evidence for a normal appearing right ovary containing at least 1 calcification. There may be a left ovary near the midline on sequence 201, image 87. Other: No free fluid. No free air. No suspicious fluid collections or abscess collections. Negative for free air. Musculoskeletal: No acute bone abnormality. IMPRESSION: Striated enhancement pattern in both kidneys with perinephric stranding, left side greater than right. Findings are suggestive for pyelonephritis. No evidence for a renal abscess or perinephric abscess. Stable right adrenal nodule. This nodule remains indeterminate. This could be more  definitively characterized with a non emergent MRI. Electronically Signed   By: Richarda OverlieAdam  Henn M.D.   On: 09/30/2015 07:58     Daralyn Bert Hulen SkainsM Leshonda Galambos, DO 10/01/2015, 4:53 AM PGY-2, [facility] FPTS Intern pager: [phone], text pages welcome

## 2015-10-01 NOTE — Progress Notes (Signed)
NURSING PROGRESS NOTE  Mandy Garza [id number] Transfer Data: 10/01/2015 4:36 PM Attending Provider: Leighton Roachodd D McDiarmid, MD WUJ:[id number]:Enobong, [name], MD Code Status: full   Mandy Girtelli Dun is a 45 y.o. female patient transferred from 2C -No acute distress noted.  -No complaints of shortness of breath.  -No complaints of chest pain.   Cardiac Monitoring: Box # 26 in place. Cardiac monitor yields:normal  Blood pressure 110/64, pulse 85, temperature 98.4 F (36.9 C), temperature source Oral, resp. rate 19, height 4\' 8"  (1.422 m), weight 116.3 kg (256 lb 6.3 oz), SpO2 96 %.   IV Fluids:  IV in place, occlusive dsg intact without redness, SL  Allergies:  Review of patient's allergies indicates no known allergies.  Past Medical History:   has a past medical history of Obesity; Hypertension; High cholesterol; Pneumonia (~ 2008); Type II diabetes mellitus (HCC); and Headache.  Past Surgical History:   has past surgical history that includes Abdominal hysterectomy (03/2002) and Cesarean section (1998; 2003).  Social History:   reports that she has never smoked. She has never used smokeless tobacco. She reports that she does not drink alcohol or use illicit drugs.  Skin: intact  Patient/Family orientated to room. Information packet given to patient/family. Admission inpatient armband information verified with patient/family to include name and date of birth and placed on patient arm. Side rails up x 2, fall assessment and education completed with patient/family. Patient/family able to verbalize understanding of risk associated with falls and verbalized understanding to call for assistance before getting out of bed. Call light within reach. Patient/family able to voice and demonstrate understanding of unit orientation instructions.    Will continue to evaluate and treat per MD orders.

## 2015-10-02 DIAGNOSIS — D696 Thrombocytopenia, unspecified: Secondary | ICD-10-CM

## 2015-10-02 LAB — RENAL FUNCTION PANEL
ALBUMIN: 2.2 g/dL — AB (ref 3.5–5.0)
ANION GAP: 10 (ref 5–15)
BUN: 7 mg/dL (ref 6–20)
CHLORIDE: 104 mmol/L (ref 101–111)
CO2: 21 mmol/L — ABNORMAL LOW (ref 22–32)
Calcium: 7.3 mg/dL — ABNORMAL LOW (ref 8.9–10.3)
Creatinine, Ser: 0.78 mg/dL (ref 0.44–1.00)
Glucose, Bld: 228 mg/dL — ABNORMAL HIGH (ref 65–99)
PHOSPHORUS: 1.7 mg/dL — AB (ref 2.5–4.6)
POTASSIUM: 4.8 mmol/L (ref 3.5–5.1)
Sodium: 135 mmol/L (ref 135–145)

## 2015-10-02 LAB — CBC
HEMATOCRIT: 38.4 % (ref 36.0–46.0)
Hemoglobin: 12.1 g/dL (ref 12.0–15.0)
MCH: 26.6 pg (ref 26.0–34.0)
MCHC: 31.5 g/dL (ref 30.0–36.0)
MCV: 84.4 fL (ref 78.0–100.0)
Platelets: 79 10*3/uL — ABNORMAL LOW (ref 150–400)
RBC: 4.55 MIL/uL (ref 3.87–5.11)
RDW: 13.7 % (ref 11.5–15.5)
WBC: 5.5 10*3/uL (ref 4.0–10.5)

## 2015-10-02 LAB — GLUCOSE, CAPILLARY
GLUCOSE-CAPILLARY: 221 mg/dL — AB (ref 65–99)
GLUCOSE-CAPILLARY: 286 mg/dL — AB (ref 65–99)
GLUCOSE-CAPILLARY: 257 mg/dL — AB (ref 65–99)
Glucose-Capillary: 177 mg/dL — ABNORMAL HIGH (ref 65–99)

## 2015-10-02 LAB — URINE CULTURE: Culture: >=100000 — AB

## 2015-10-02 LAB — PHOSPHORUS
PHOSPHORUS: 1.3 mg/dL — AB (ref 2.5–4.6)
Phosphorus: 1.3 mg/dL — ABNORMAL LOW (ref 2.5–4.6)

## 2015-10-02 MED ORDER — SODIUM CHLORIDE 0.9 % IV BOLUS (SEPSIS)
500.0000 mL | Freq: Once | INTRAVENOUS | Status: AC
  Administered 2015-10-02: 500 mL via INTRAVENOUS

## 2015-10-02 MED ORDER — DEXTROSE 5 % IV SOLN
2.0000 g | INTRAVENOUS | Status: DC
  Administered 2015-10-02 – 2015-10-03 (×2): 2 g via INTRAVENOUS
  Filled 2015-10-02 (×3): qty 2

## 2015-10-02 MED ORDER — INSULIN NPH (HUMAN) (ISOPHANE) 100 UNIT/ML ~~LOC~~ SUSP
25.0000 [IU] | Freq: Two times a day (BID) | SUBCUTANEOUS | Status: DC
  Administered 2015-10-02 – 2015-10-03 (×2): 25 [IU] via SUBCUTANEOUS
  Filled 2015-10-02: qty 10

## 2015-10-02 NOTE — Progress Notes (Signed)
Family Medicine Teaching Service Daily Progress Note Intern Pager: [phone]  Patient name: Mandy Garza Medical record number: [id number] Date of birth: 1971-01-01 Age: 45 y.o. Gender: female  Primary Care Provider: Jaclyn Shaggy, MD Consultants: None Code Status: Full (per discussion on admission)  Pt Overview and Major Events to Date:  5/25: Admit to FPTS, tx for sepsis  Assessment and Plan: Mandy Garza is a 45 y.o. female presenting with right flank pain, urinary symptoms, fevers and night sweats. PMH is significant for poorly controlled DM2, HTN. HLD and pyelonephritis with pyelonephric abscess.   Pyelonephritis / Sepsis:  Continues to be intermittently febrile. Initial CT without signs of abscess.  - Urine culture: Pan sensitive ecoli - Blood culture: no growth to date (collected after CTX in ED) - CTX x1 dose 5/25, 5/28- - Vanc 5/25-5/26 - Zosyn 5/26-5/28 - Supportive care: tylenol, morphine, NS @ 125 mL/hr, continue until taking good PO - Strict I/Os (not being recorded) - If still spiking fevers today, consider repeat imaging.   Vaginal yeast infection: Yeast seen on wet prep. Likely secondary to uncontrolled DM. s/p Fluconazole 150 mg single dose given.  Hyperglycemia in the setting of DM2: Uncontrolled at baseline. A1c16.6 on 02/2015, A1C 15.3 on 5/25. Home dose: Lantus 55 units QD and Novolog 5 units but does not take due to cost.   - NPH 25 units BID - titrate as needed - Resistant SSI - Monitor CBGs   Hypophosphatemia, hypokalemia (resolved) and Magnesium (resolved): Likely secondary to poor PO intake and n/v.  - Check vitamin D - Kphos 500 mg TID - Trend RFP  AKI: improving. Cr 1.22> 1.07 this am. Likely 2/2 dehydration.  - mIVF @ 125 mL/hr - Daily BMET  Thrombocytopenia.  Plt 157 on admission. 79 today. HIT score 2 - Stopped enoxaparin and started SCD - Trend CBC  HTN: Normotensive  - Hold home BP meds - mIVF @ 125 mL/hr  HLD: - On  home dose of atorvastatin 40 mg  Prolonged QTc: QTc of 480 of telemetry. EKGs showing QTc of 660 (innacurate) - Continue on tele - Will be cautious with QT prolonging medications - Repeat EKG  FEN/GI: mIVF and HH/Carb modified diet  Prophylaxis: Lovenox  Disposition: SDU  Subjective:  Still with intermittent fevers. Overall, feels "about the same." Still not taking much PO.   Objective: Temp:  [98.4 F (36.9 C)-104.6 F (40.3 C)] 99.8 F (37.7 C) (05/28 0739) Pulse Rate:  [85-94] 94 (05/28 0538) Resp:  [13-19] 18 (05/28 0538) BP: (105-117)/(54-73) 105/66 mmHg (05/28 0538) SpO2:  [95 %-98 %] 95 % (05/28 0538) Weight:  [259 lb 4.2 oz (117.6 kg)] 259 lb 4.2 oz (117.6 kg) (05/28 0538) Physical Exam: General: Morbidly obese. Lying in bed. NAD Cardiovascular: RRR. Slight outflow murmur on left sternal border. No gallops, S3 or S4 appreciated.  Respiratory: CTAB. No increased WOB. No wheezes ronchi or rales.  Abdomen: Obese. NTTP. +BS MDK: +CVA tenderness on R (improving). Negative CVA on left.  Psych: Pleasant. Full affect. Mood stable.  Laboratory:  Recent Labs Lab 09/30/15 0311 10/01/15 0904 10/02/15 0409  WBC 11.1* 6.8 5.5  HGB 14.5 13.1 12.1  HCT 43.0 38.9 38.4  PLT 131* 94* 79*    Recent Labs Lab 09/29/15 1051  09/30/15 0311 10/01/15 0344 10/02/15 0409  NA 134*  --  135 134* 135  K 3.6  --  3.7 2.5* 4.8  CL 96*  --  104 101 104  CO2 22  --  20*  20* 21*  BUN 13  --  13 10 7   CREATININE 1.02*  < > 1.22* 1.07* 0.78  CALCIUM 9.2  --  7.9* 7.4* 7.3*  PROT 8.2*  --   --   --   --   BILITOT 0.8  --   --   --   --   ALKPHOS 166*  --   --   --   --   ALT 42  --   --   --   --   AST 65*  --   --   --   --   GLUCOSE 431*  --  303* 309* 228*  < > = values in this interval not displayed.   Imaging/Diagnostic Tests: No results found.   Ardith Darkaleb M Parker, MD 10/02/2015, 9:51 AM PGY-2, [facility] FPTS Intern pager: [phone], text pages  welcome

## 2015-10-02 NOTE — Progress Notes (Signed)
MD notified that patient spiked a fever of 104.6 rectally last night.  RR RN was notified.  Tylenol was given, and fever reduced to 99.9.  Continuous fluids are running.  No new orders given at this time.  Will continue to monitor patient.

## 2015-10-03 ENCOUNTER — Inpatient Hospital Stay (HOSPITAL_COMMUNITY): Payer: Self-pay

## 2015-10-03 LAB — BASIC METABOLIC PANEL
Anion gap: 11 (ref 5–15)
BUN: <5 mg/dL — ABNORMAL LOW (ref 6–20)
CALCIUM: 7.5 mg/dL — AB (ref 8.9–10.3)
CO2: 24 mmol/L (ref 22–32)
CREATININE: 0.83 mg/dL (ref 0.44–1.00)
Chloride: 103 mmol/L (ref 101–111)
Glucose, Bld: 254 mg/dL — ABNORMAL HIGH (ref 65–99)
Potassium: 2.7 mmol/L — CL (ref 3.5–5.1)
Sodium: 138 mmol/L (ref 135–145)

## 2015-10-03 LAB — CBC
HCT: 37.2 % (ref 36.0–46.0)
Hemoglobin: 12.5 g/dL (ref 12.0–15.0)
MCH: 28.8 pg (ref 26.0–34.0)
MCHC: 33.6 g/dL (ref 30.0–36.0)
MCV: 85.7 fL (ref 78.0–100.0)
PLATELETS: 116 10*3/uL — AB (ref 150–400)
RBC: 4.34 MIL/uL (ref 3.87–5.11)
RDW: 13.8 % (ref 11.5–15.5)
WBC: 6.5 10*3/uL (ref 4.0–10.5)

## 2015-10-03 LAB — GLUCOSE, CAPILLARY
GLUCOSE-CAPILLARY: 256 mg/dL — AB (ref 65–99)
Glucose-Capillary: 215 mg/dL — ABNORMAL HIGH (ref 65–99)
Glucose-Capillary: 192 mg/dL — ABNORMAL HIGH (ref 65–99)
Glucose-Capillary: 198 mg/dL — ABNORMAL HIGH (ref 65–99)

## 2015-10-03 LAB — PHOSPHORUS: Phosphorus: 1.2 mg/dL — ABNORMAL LOW (ref 2.5–4.6)

## 2015-10-03 MED ORDER — SODIUM CHLORIDE 0.9 % IV SOLN
INTRAVENOUS | Status: DC
  Administered 2015-10-03 (×2): via INTRAVENOUS
  Filled 2015-10-03 (×5): qty 1000

## 2015-10-03 MED ORDER — IOPAMIDOL (ISOVUE-300) INJECTION 61%
INTRAVENOUS | Status: AC
  Administered 2015-10-03: 75 mL via INTRAVENOUS
  Filled 2015-10-03: qty 75

## 2015-10-03 MED ORDER — POTASSIUM CHLORIDE 10 MEQ/100ML IV SOLN
10.0000 meq | INTRAVENOUS | Status: AC
  Administered 2015-10-03 (×3): 10 meq via INTRAVENOUS
  Filled 2015-10-03 (×2): qty 100

## 2015-10-03 MED ORDER — POTASSIUM CHLORIDE 10 MEQ/100ML IV SOLN
INTRAVENOUS | Status: AC
  Administered 2015-10-03: 10 meq via INTRAVENOUS
  Filled 2015-10-03: qty 100

## 2015-10-03 MED ORDER — INSULIN NPH (HUMAN) (ISOPHANE) 100 UNIT/ML ~~LOC~~ SUSP
28.0000 [IU] | Freq: Two times a day (BID) | SUBCUTANEOUS | Status: DC
  Administered 2015-10-03 – 2015-10-04 (×2): 28 [IU] via SUBCUTANEOUS
  Filled 2015-10-03 (×2): qty 10

## 2015-10-03 MED ORDER — POTASSIUM CHLORIDE CRYS ER 20 MEQ PO TBCR: 40.0000 meq | EXTENDED_RELEASE_TABLET | Freq: Two times a day (BID) | ORAL | Status: DC

## 2015-10-03 MED ORDER — POTASSIUM PHOSPHATES 15 MMOLE/5ML IV SOLN
40.0000 meq | Freq: Once | INTRAVENOUS | Status: AC
  Administered 2015-10-03: 40 meq via INTRAVENOUS
  Filled 2015-10-03: qty 9.09

## 2015-10-03 MED ORDER — POTASSIUM CHLORIDE CRYS ER 20 MEQ PO TBCR
40.0000 meq | EXTENDED_RELEASE_TABLET | Freq: Once | ORAL | Status: AC
  Administered 2015-10-03: 40 meq via ORAL
  Filled 2015-10-03: qty 2

## 2015-10-03 MED ORDER — K PHOS MONO-SOD PHOS DI & MONO 155-852-130 MG PO TABS
500.0000 mg | ORAL_TABLET | Freq: Three times a day (TID) | ORAL | Status: DC
  Administered 2015-10-04: 500 mg via ORAL
  Filled 2015-10-03 (×3): qty 2

## 2015-10-03 NOTE — Progress Notes (Signed)
CRITICAL VALUE ALERT  Critical value received:  Phosphorus >1  Date of notification:  10/03/2015  Time of notification:  2020  Critical value read back:Yes.    Nurse who received alert:  Sue LushKaelin Romesberg  MD notified (1st page):  Jonathon JordanGambino  Time of first page:  2024  MD notified (2nd page):  Time of second page:  Responding MD:  Providence CrosbyGambina  Time MD responded:  2026

## 2015-10-03 NOTE — Care Management Note (Signed)
Case Management Note  Patient Details  Name: Mandy Garza MRN: [id number] Date of Birth: 12/27/1970  Subjective/Objective:       Admitted with abdominal pain, PMH is significant for poorly controlled DM2, HTN. HLD and pyelonephritis with pyelonephric abscess. Resides with family. PTA independent with ADL's, no DME usage.PCP: Mandy Garza.   Action/Plan: Return to home when medically stable. CM to f/u disposition with needs.  Expected Discharge Date:                  Expected Discharge Plan:  Home/Self Care  In-House Referral:     Discharge planning Services  CM Consult  Post Acute Care Choice:    Choice offered to:     DME Arranged:    DME Agency:     HH Arranged:    HH Agency:     Status of Service:  In process, will continue to follow  Medicare Important Message Given:    Date Medicare IM Given:    Medicare IM give by:    Date Additional Medicare IM Given:    Additional Medicare Important Message give by:     If discussed at Long Length of Stay Meetings, dates discussed:    Additional Comments: Mandy Garza (Dau)  [phone]   Gae GallopCole, Mandy Garza, [place]RN,BSN,CM [phone] 10/03/2015, 2:54 PM

## 2015-10-03 NOTE — Progress Notes (Signed)
Family Medicine Teaching Service Daily Progress Note Intern Pager: [phone]  Patient name: Janeliz Prestwood Medical record number: [id number] Date of birth: [birth year] Age: 45 y.o. Gender: female  Primary Care Provider: Jaclyn Shaggy, MD Consultants: None Code Status: Full (per discussion on admission)  Pt Overview and Major Events to Date:  5/25: Admit to FPTS, tx for sepsis 5/29: repeat CT Ab/pelvis due to continued fevers  Assessment and Plan: Mabel Roll is a 45 y.o. female presenting with right flank pain, urinary symptoms, fevers and night sweats. PMH is significant for poorly controlled DM2, HTN. HLD and pyelonephritis with pyelonephric abscess.   Pyelonephritis / Sepsis:  Continues to be intermittently febrile. Initial CT without signs of abscess.  - Urine culture: Pan sensitive ecoli - Blood culture: no growth to date (collected after CTX in ED) - CTX x1 dose 5/25, (5/28-  ) - Vanc 5/25-5/26 - Zosyn 5/26-5/28 - Supportive care: tylenol, morphine, NS @ 125 mL/hr, continue until taking good PO - Strict I/Os (not being recorded) - Repeat Ab Pelvis CT  Poor PO intake:  - Continue Ensure  Vaginal yeast infection: Yeast seen on wet prep. Likely secondary to uncontrolled DM. s/p Fluconazole 150 mg single dose given.  Hyperglycemia in the setting of DM2: Uncontrolled at baseline. A1c16.6 on 02/2015, A1C 15.3 on 5/25. Home dose: Lantus 55 units QD and Novolog 5 units but does not take due to cost.   - Increase NPH to 28 units BID - titrate as needed - Resistant SSI - Monitor CBGs   Hypophosphatemia, hypokalemia and Magnesium (resolved): Likely secondary to poor PO intake and n/v.  - Check vitamin D (in process) - Trend RFP - K 2.7 this AM, will give KCl 10 meq x3 and 40 KDUR. Will add 40 of K to fluids - Phos 1.2 this AM, was getting Kphos 500 mg TID, will add skim milk  AKI: resolved - mIVF @ 125 mL/hr due to poor PO intake - Daily BMET  Thrombocytopenia.   Plt 157 on admission. 79 yesterday. HIT score 2 and Lovenox stopped yesterday. Plt 116 today.  - Hold enoxaparin and continue SCDs - Trend CBC  HTN: Normotensive  - Hold home BP meds - mIVF @ 125 mL/hr  HLD: - On home dose of atorvastatin 40 mg  Prolonged QTc: QTc of 480 of telemetry. EKGs showing QTc of 660 (innacurate) - Continue to monitor on tele - Will be cautious with QT prolonging medications  FEN/GI: mIVF and HH/Carb modified diet  Prophylaxis: Lovenox  Disposition: SDU  Subjective:  Patient states she is still feeling intermittently febrile. PO intake has not improved. Endorses L flank pain this AM, controlled with tylenol.   Objective: Temp:  [99.5 F (37.5 C)-103.8 F (39.9 C)] 101.5 F (38.6 C) (05/29 0729) Pulse Rate:  [84-94] 94 (05/28 1954) Resp:  [18-20] 20 (05/28 1954) BP: (91-111)/(62) 111/62 mmHg (05/28 1954) SpO2:  [94 %] 94 % (05/28 1954) Weight:  [259 lb 14.8 oz (117.9 kg)] 259 lb 14.8 oz (117.9 kg) (05/28 2043) Physical Exam: General: Morbidly obese. Lying in bed. NAD Cardiovascular: RRR. No murmur appreciated Respiratory: CTAB. No increased WOB. No wheezes ronchi or rales.  Abdomen: Obese. NTTP. +BS MDK: +CVA tenderness on L. Negative CVA on right Psych: Pleasant. Full affect. Mood stable.  Laboratory:  Recent Labs Lab 10/01/15 0904 10/02/15 0409 10/03/15 0420  WBC 6.8 5.5 6.5  HGB 13.1 12.1 12.5  HCT 38.9 38.4 37.2  PLT 94* 79* 116*    Recent Labs  Lab 09/29/15 1051  10/01/15 0344 10/02/15 0409 10/03/15 0420  NA 134*  < > 134* 135 138  K 3.6  < > 2.5* 4.8 2.7*  CL 96*  < > 101 104 103  CO2 22  < > 20* 21* 24  BUN 13  < > 10 7 <5*  CREATININE 1.02*  < > 1.07* 0.78 0.83  CALCIUM 9.2  < > 7.4* 7.3* 7.5*  PROT 8.2*  --   --   --   --   BILITOT 0.8  --   --   --   --   ALKPHOS 166*  --   --   --   --   ALT 42  --   --   --   --   AST 65*  --   --   --   --   GLUCOSE 431*  < > 309* 228* 254*  < > = values in this interval  not displayed.   Imaging/Diagnostic Tests: No results found.   Beaulah Dinninghristina M Omari Mcmanaway, MD 10/03/2015, 8:36 AM PGY-1,  Family Medicine FPTS Intern pager: [phone], text pages welcome

## 2015-10-03 NOTE — Progress Notes (Signed)
Inpatient Diabetes Program Recommendations  AACE/ADA: New Consensus Statement on Inpatient Glycemic Control (2015)  Target Ranges:  Prepandial:   less than 140 mg/dL      Peak postprandial:   less than 180 mg/dL (1-2 hours)      Critically ill patients:  140 - 180 mg/dL   Review of Glycemic Control  Results for Mandy Garza, [name] (MRN [id number]) as of 10/03/2015 15:14  Ref. Range 10/02/2015 11:55 10/02/2015 17:30 10/02/2015 22:06 10/03/2015 08:18 10/03/2015 12:34  Glucose-Capillary Latest Ref Range: 65-99 mg/dL 409286 (H) 811257 (H) 914177 (H) 256 (H) 215 (H)   Post-prandial blood sugars elevated. Needs meal coverage insulin.  Inpatient Diabetes Program Recommendations:    Add Novolog 5 units tidwc for meal coverage insulin. Titrate NPH until FBS < 180 mg/dL.  Continue to follow. Thank you. Ailene Ardshonda Karielle Davidow, RD, LDN, CDE Inpatient Diabetes Coordinator [phone]

## 2015-10-04 LAB — CULTURE, BLOOD (ROUTINE X 2)
CULTURE: NO GROWTH
CULTURE: NO GROWTH

## 2015-10-04 LAB — RENAL FUNCTION PANEL
Albumin: 2.3 g/dL — ABNORMAL LOW (ref 3.5–5.0)
Anion gap: 10 (ref 5–15)
CALCIUM: 7.8 mg/dL — AB (ref 8.9–10.3)
CHLORIDE: 104 mmol/L (ref 101–111)
CO2: 23 mmol/L (ref 22–32)
CREATININE: 0.73 mg/dL (ref 0.44–1.00)
Glucose, Bld: 194 mg/dL — ABNORMAL HIGH (ref 65–99)
Phosphorus: 2.1 mg/dL — ABNORMAL LOW (ref 2.5–4.6)
Potassium: 3.5 mmol/L (ref 3.5–5.1)
SODIUM: 137 mmol/L (ref 135–145)

## 2015-10-04 LAB — CBC
HCT: 34.7 % — ABNORMAL LOW (ref 36.0–46.0)
Hemoglobin: 11.5 g/dL — ABNORMAL LOW (ref 12.0–15.0)
MCH: 28.1 pg (ref 26.0–34.0)
MCHC: 33.1 g/dL (ref 30.0–36.0)
MCV: 84.8 fL (ref 78.0–100.0)
PLATELETS: 166 10*3/uL (ref 150–400)
RBC: 4.09 MIL/uL (ref 3.87–5.11)
RDW: 14.1 % (ref 11.5–15.5)
WBC: 7 10*3/uL (ref 4.0–10.5)

## 2015-10-04 LAB — BASIC METABOLIC PANEL
ANION GAP: 8 (ref 5–15)
CHLORIDE: 107 mmol/L (ref 101–111)
CO2: 22 mmol/L (ref 22–32)
Calcium: 7.6 mg/dL — ABNORMAL LOW (ref 8.9–10.3)
Creatinine, Ser: 0.73 mg/dL (ref 0.44–1.00)
GFR calc Af Amer: >60 mL/min (ref >60)
Glucose, Bld: 219 mg/dL — ABNORMAL HIGH (ref 65–99)
POTASSIUM: 3.3 mmol/L — AB (ref 3.5–5.1)
SODIUM: 137 mmol/L (ref 135–145)

## 2015-10-04 LAB — VITAMIN D 25 HYDROXY (VIT D DEFICIENCY, FRACTURES): VIT D 25 HYDROXY: 7.7 ng/mL — AB (ref 30.0–100.0)

## 2015-10-04 LAB — GLUCOSE, CAPILLARY: GLUCOSE-CAPILLARY: 179 mg/dL — AB (ref 65–99)

## 2015-10-04 MED ORDER — AMOXICILLIN 500 MG PO CAPS: 500.0000 mg | ORAL_CAPSULE | Freq: Three times a day (TID) | ORAL | Status: DC

## 2015-10-04 MED ORDER — AMOXICILLIN 500 MG PO CAPS
500.0000 mg | ORAL_CAPSULE | Freq: Two times a day (BID) | ORAL | Status: DC
  Administered 2015-10-04: 500 mg via ORAL
  Filled 2015-10-04 (×2): qty 1

## 2015-10-04 MED ORDER — BISMUTH SUBSALICYLATE 262 MG/15ML PO SUSP
30.0000 mL | ORAL | Status: DC | PRN
  Filled 2015-10-04: qty 118

## 2015-10-04 MED FILL — AMOXICILLIN 500 MG CAPSULE: 500 | 10 days supply | Qty: 30 | Fill #0

## 2015-10-04 NOTE — Progress Notes (Signed)
NURSING PROGRESS NOTE  Mandy Garza [id number] Discharge Data: 10/04/2015 6:07 PM Attending Provider: Leighton Roachodd D McDiarmid, MD QMV:[id number]:Enobong, [name], MD     Mandy GirtNelli Fong to be D/C'd Home per MD order.  Discussed with the patient the After Visit Summary and all questions fully answered. All IV's discontinued with no bleeding noted. All belongings returned to patient for patient to take home. Pt given 1x dose of antibiotic to take home.  Last Vital Signs:  Blood pressure 119/70, pulse 70, temperature 98.3 F (36.8 C), temperature source Oral, resp. rate 16, height 4\' 8"  (1.422 m), weight 112.719 kg (248 lb 8 oz), SpO2 97 %.  Discharge Medication List   Medication List    STOP taking these medications        fluconazole 150 MG tablet  Commonly known as:  DIFLUCAN      TAKE these medications        amoxicillin 500 MG capsule  Commonly known as:  AMOXIL  Take 1 capsule (500 mg total) by mouth 3 (three) times daily.     atorvastatin 40 MG tablet  Commonly known as:  LIPITOR  Take 1 tablet (40 mg total) by mouth daily.     insulin aspart 100 UNIT/ML injection  Commonly known as:  NOVOLOG  Inject 5 Units into the skin 3 (three) times daily with meals.     insulin glargine 100 UNIT/ML injection  Commonly known as:  LANTUS  Inject 0.55 mLs (55 Units total) into the skin daily.

## 2015-10-04 NOTE — Discharge Instructions (Signed)
We are glad that you are feeling much better.   Continue to take the oral antibiotic 3 times per day for the next 10 days or until the antibiotic is completely gone.   Follow up with Dr. Cathlean CowerMikell at the family medicine clinic on 6/9 at 1:30 pm.   Thanks for letting us take care of you.   Sincrely, Devota Pacealeb Gorge Almanza, MD Family Medicine -PGY 2

## 2015-10-04 NOTE — Discharge Summary (Signed)
Family Medicine Teaching [facility] Discharge Summary  Patient name: Mandy Garza Medical record number: [id number] Date of birth: May 28, 1970 Age: 45 y.o. Gender: female Date of Admission: 09/29/2015  Date of Discharge: 10/04/15 Admitting Physician: Leighton Roach McDiarmid, MD  Primary Care Provider: Jaclyn Shaggy, MD Consultants: None  Indication for Hospitalization: sepsis due to pyelonephritis  Discharge Diagnoses/Problem List:  Patient Active Problem List   Diagnosis Date Noted  . Thrombocytopenia (HCC)   . AKI (acute kidney injury) (HCC) 09/30/2015  . Hyperglycemia 09/29/2015  . Pyelonephritis 09/29/2015  . Sepsis (HCC)   . Arterial hypotension   . Fever, unspecified   . Obesity 04/08/2015  . Elevated liver enzymes 04/08/2015  . Pure hypercholesterolemia 04/08/2015  . Hyperlipidemia 03/11/2015  . Perinephric abscess 02/14/2015  . Poorly controlled diabetes mellitus (HCC) 02/14/2015  . DKA (diabetic ketoacidoses) (HCC) 02/14/2015  . Hyponatremia 02/14/2015  . Essential hypertension 02/14/2015   Disposition: Home  Discharge Condition: stable  Discharge Exam: see previous progress note  Brief Hospital Course:  Mandy Garza is a 45 y.o. female who presented with right flank pain, urinary symptoms, fevers and night sweats. PMH is significant for poorly controlled DM2, HTN. HLD and pyelonephritis with pyelonephric abscess.   Pyelonephritis / Sepsis:  Patient presented meeting SIRS criteria with tachycardia, hypotension, fever, and leukocytosis to 11.8. Home BP meds were held. Initial CT abdomen/pelvis without signs of abscess. Patient was given Ceftriaxone x1 dose 5/25, due to sepsis picture, antibiotics were broadened to Vancomycin (5/25-5/26) and Zosyn (5/26-5/28). She was given IVF hydration and electrolytes were repleted as needed (Mag, K, and Phos). Creatinine was trended due to notable AKI, AKI resolved after IVF. Pain control: tylenol and morphine. Urine  culture grew pan sensitive E.coli. Blood culture had NG x 4 days (collected after CTX in ED). Patient was transitioned back to Ceftriaxone (5/28-5/30). She continued to spike fevers, repeat CT abdomen/pelvis was performed and was reassuringly without signs of abscess or drainable fluid. Once afebrile for 24 hours, patient was transitioned to PO Amoxicillin 500 mg TID on 5/30. Upon discharge, patient was afebrile with stable vital signs and able to tolerate good PO.   Hyperglycemia in the setting of DM2:  CGBs were checked 4 times daily, insulin was titrated according to patient's glucose. While hospitalized, she was switched from Lantus to NPH for better affordability. She was on NPH to 28 units BID with glucose in the high 100's-low 200's. Upon discharge, patient instructed to resume home meds of Lantus and 5 units of Novolog TID  Vaginal yeast infection:  Yeast seen on wet prep. Likely secondary to uncontrolled DM. Fluconazole 150 mg single dose given.  Thrombocytopenia. Plt 157 on admission. Plt on 5/28, 79 HIT score 2 and Lovenox stopped. Platelets continued to rise after Lovenox was stopped and upon discharge they were 166  Issues for Follow Up:  1. Pyelonephritis: Ensure patient's symptoms have resolved. She will need to take Amoxicillin 500 mg TID to complete a 10 day course (5/30-6/8) 2. Diabetes: Consider switching from home Lantus to NPH for better affordability for patient (she was previously noncompliant with Lantus due to cost). She tolerated 28 units of NPH BID while in the hospital.  3. Electrolytes: Check BMP and phosphorus to ensure electrolytes remained stable after discharge. 4. Incidental findings on CT: Please add to problem list: "Right adrenal nodule, indeterminate by density, stable in size since 02/14/2015, suggesting a benign adenoma. A follow-up adrenal mass protocol CT or MRI abdomen without and with IV contrast is  recommended in 12 months to document  stability."  Significant Procedures: None  Significant Labs and Imaging:   Recent Labs Lab 10/02/15 0409 10/03/15 0420 10/04/15 0626  WBC 5.5 6.5 7.0  HGB 12.1 12.5 11.5*  HCT 38.4 37.2 34.7*  PLT 79* 116* 166    Recent Labs Lab 09/29/15 1051 09/29/15 1744  10/01/15 0344 10/01/15 0737  10/02/15 0409 10/02/15 0824 10/02/15 1814 10/03/15 0420 10/03/15 0739 10/03/15 1853 10/04/15 0626  NA 134*  --   < > 134*  --   --  135  --   --  138  --  137 137  K 3.6  --   < > 2.5*  --   --  4.8  --   --  2.7*  --  3.3* 3.5  CL 96*  --   < > 101  --   --  104  --   --  103  --  107 104  CO2 22  --   < > 20*  --   --  21*  --   --  24  --  22 23  GLUCOSE 431*  --   < > 309*  --   --  228*  --   --  254*  --  219* 194*  BUN 13  --   < > 10  --   --  7  --   --  <5*  --  <5* <5*  CREATININE 1.02* 1.19*  < > 1.07*  --   --  0.78  --   --  0.83  --  0.73 0.73  CALCIUM 9.2  --   < > 7.4*  --   --  7.3*  --   --  7.5*  --  7.6* 7.8*  MG  --  1.8  --   --  2.5*  --   --   --   --   --   --   --   --   PHOS  --  1.8*  < > 1.9* 2.2*  < > 1.7* 1.3* 1.3*  --  1.2* <1.0* 2.1*  ALKPHOS 166*  --   --   --   --   --   --   --   --   --   --   --   --   AST 65*  --   --   --   --   --   --   --   --   --   --   --   --   ALT 42  --   --   --   --   --   --   --   --   --   --   --   --   ALBUMIN 3.5  --   --  2.4*  --   --  2.2*  --   --   --   --   --  2.3*  < > = values in this interval not displayed.  Ct Abdomen Pelvis W Contrast  10/03/2015  CLINICAL DATA:  45 year old female inpatient admitted with acute pyelonephritis, on antibiotics. Persistent right flank pain and fever. EXAM: CT ABDOMEN AND PELVIS WITH CONTRAST TECHNIQUE: Multidetector CT imaging of the abdomen and pelvis was performed using the standard protocol following bolus administration of intravenous contrast. CONTRAST:  75 cc ISOVUE-300 IOPAMIDOL (ISOVUE-300) INJECTION 61% COMPARISON:  09/30/2015 CT abdomen/ pelvis. FINDINGS: Lower  chest: No significant pulmonary nodules or acute consolidative airspace disease. Oral contrast is present in the lower thoracic esophagus. Hepatobiliary: Diffuse hepatic steatosis. No liver mass. No definite liver surface irregularity. Normal gallbladder with no radiopaque cholelithiasis. No biliary ductal dilatation. Pancreas: Normal, with no mass or duct dilation. Spleen: Normal size. No mass. Adrenals/Urinary Tract: Right adrenal 2.0 cm nodule is not appreciably changed in size since 02/14/2015 using similar measurement technique. No left adrenal nodule. No hydronephrosis. Bilateral mild perinephric fat stranding, not definitely changed. Persistent diffuse patchiness of the bilateral contrast nephrograms, not appreciably changed. Stable fat stranding surrounding the bilateral ureters. No renal mass or discrete renal fluid collection. No renal or perinephric gas. Normal bladder. Stomach/Bowel: Grossly normal stomach. Normal caliber small bowel with no small bowel wall thickening. Normal appendix. Normal large bowel with no diverticulosis, large bowel wall thickening or pericolonic fat stranding. Vascular/Lymphatic: Normal caliber abdominal aorta. Patent portal, splenic, hepatic and renal veins. No pathologically enlarged lymph nodes in the abdomen or pelvis. Reproductive: Status post hysterectomy, with no abnormal findings at the hysterectomy margin. No adnexal mass. Other: No pneumoperitoneum, ascites or focal fluid collection. Musculoskeletal: No aggressive appearing focal osseous lesions. Moderate degenerative changes in the visualized thoracolumbar spine. IMPRESSION: 1. Persistent CT findings of bilateral acute pyelonephritis. No evidence of a discrete drainable renal or perinephric fluid collection. No evidence of emphysematous pyelonephritis. No renal vein thrombosis. 2. Right adrenal nodule, indeterminate by density, stable in size since 02/14/2015, suggesting a benign adenoma. A follow-up adrenal mass  protocol CT or MRI abdomen without and with IV contrast is recommended in 12 months to document stability. This recommendation follows ACR consensus guidelines: Managing Incidental Findings on Abdominal CT: White Paper of the ACR Incidental Findings Committee. J Am Coll Radiol 2010;7:754-773 3. Diffuse hepatic steatosis. 4. Oral contrast in the lower thoracic esophagus, suggesting esophageal dysmotility and/or gastroesophageal reflux. Electronically Signed   By: Delbert Phenix M.D.   On: 10/03/2015 11:29     Results/Tests Pending at Time of Discharge: None  Discharge Medications:    Medication List    STOP taking these medications        fluconazole 150 MG tablet  Commonly known as:  DIFLUCAN     insulin glargine 100 UNIT/ML injection  Commonly known as:  LANTUS      TAKE these medications        amoxicillin 500 MG capsule  Commonly known as:  AMOXIL  Take 1 capsule (500 mg total) by mouth 3 (three) times daily.     atorvastatin 40 MG tablet  Commonly known as:  LIPITOR  Take 1 tablet (40 mg total) by mouth daily.     insulin aspart 100 UNIT/ML injection  Commonly known as:  NOVOLOG  Inject 5 Units into the skin 3 (three) times daily with meals.        Discharge Instructions: Please refer to Patient Instructions section of EMR for full details.  Patient was counseled important signs and symptoms that should prompt return to medical care, changes in medications, dietary instructions, activity restrictions, and follow up appointments.   Follow-Up Appointments:     Follow-up Information    Follow up with Danella Maiers, MD. Go on 10/14/2015.   Specialty:  Family Medicine   Why:  1:30pm Hospital Follow Up appointment.    Contact information:   [address] [place] [address] [phone]       Beaulah Dinning, MD 10/04/2015, 5:03 PM PGY-1, [facility]

## 2015-10-04 NOTE — Progress Notes (Signed)
Family Medicine Teaching Service Daily Progress Note Intern Pager: [phone]  Patient name: Mandy Garza Medical record number: [id number] Date of birth: 1971/03/08 Age: 45 y.o. Gender: female  Primary Care Provider: Jaclyn Shaggy, MD Consultants: None Code Status: Full (per discussion on admission)  Pt Overview and Major Events to Date:  5/25: Admit to FPTS, tx for sepsis 5/29: repeat CT Ab/pelvis due to continued fevers 5/30: Afebrile for 24 hours  Assessment and Plan: Mandy Garza is a 45 y.o. female presenting with right flank pain, urinary symptoms, fevers and night sweats. PMH is significant for poorly controlled DM2, HTN. HLD and pyelonephritis with pyelonephric abscess.   Pyelonephritis / Sepsis:  Has not been febrile since yesterday AM, vital signs stable. Repeat CT without signs of abscess or drainable fluid. Urine culture: Pan sensitive ecoli. Blood culture: NG x 4 days (collected after CTX in ED) - CTX x1 dose 5/25, (5/28-5/30), has been afebrile for 24 hours, will transition to PO Amoxicillin 500 mg TID - Vanc 5/25-5/26 - Zosyn 5/26-5/28 - Supportive care: Tylenol, morphine, NS @ 125 mL/hr, continue until taking good PO  Poor PO intake:  - Continue Ensure  Vaginal yeast infection: Yeast seen on wet prep. Likely secondary to uncontrolled DM. s/p Fluconazole 150 mg single dose given.  Hyperglycemia in the setting of DM2: Uncontrolled at baseline. A1c16.6 on 02/2015, A1C 15.3 on 5/25. Home dose: Lantus 55 units QD and Novolog 5 units but does not take due to cost.  Glucose 256-194 over 24 hours. 194 fasting CBG this AM. Has required 18 units SSI over 24 hours.  - Continue NPH to 28 units BID - titrate as needed  - Resistant SSI - Monitor CBGs   Hypophosphatemia, hypokalemia (resolved) and Magnesium (resolved): Likely secondary to poor PO intake and n/v. Goal K of >4 - Check vitamin D (in process) - Trend RFP - Phos 2.1 this AM, continue Kphos 500 mg TID,  will add skim milk as well  AKI: resolved - mIVF @ 125 mL/hr due to poor PO intake - Daily BMET  Thrombocytopenia.  Plt 157 on admission. Plt on 5/28, 79 HIT score 2 and Lovenox stopped yesterday. Plt 116>166 - Hold enoxaparin and continue SCDs - Trend CBC  HTN: Normotensive  - Hold home BP meds - mIVF @ 125 mL/hr  HLD: - On home dose of atorvastatin 40 mg  Prolonged QTc: QTc of 480 of telemetry. EKGs showing QTc of 660 (innacurate) - Continue to monitor on tele - Will be cautious with QT prolonging medications  FEN/GI: mIVF and HH/Carb modified diet  Prophylaxis: Lovenox  Disposition: med-surg  Subjective:  Patient afebrile overnight. PO intake has not improved per patient. Endorses bilateral flank pain this AM, controlled with tylenol. Is having some diarrhea, 6 BMs yesterday.  Objective: Temp:  [98.3 F (36.8 C)-101.5 F (38.6 C)] 98.5 F (36.9 C) (05/30 0543) Pulse Rate:  [76-81] 76 (05/30 0543) Resp:  [20] 20 (05/30 0543) BP: (98-114)/(60-75) 98/68 mmHg (05/30 0543) SpO2:  [94 %-97 %] 94 % (05/30 0543) Weight:  [248 lb 8 oz (112.719 kg)] 248 lb 8 oz (112.719 kg) (05/30 0543) Physical Exam: General: Morbidly obese. Lying in bed. NAD Cardiovascular: RRR. No murmur appreciated Respiratory: CTAB. No increased WOB. No wheezes ronchi or rales.  Abdomen: Obese. NTTP. +BS MDK: no CVA tenderness Psych: Pleasant. Full affect. Mood stable.  Laboratory:  Recent Labs Lab 10/02/15 0409 10/03/15 0420 10/04/15 0626  WBC 5.5 6.5 7.0  HGB 12.1 12.5 11.5*  HCT  38.4 37.2 34.7*  PLT 79* 116* 166    Recent Labs Lab 09/29/15 1051  10/03/15 0420 10/03/15 1853 10/04/15 0626  NA 134*  < > 138 137 137  K 3.6  < > 2.7* 3.3* 3.5  CL 96*  < > 103 107 104  CO2 22  < > 24 22 23   BUN 13  < > <5* <5* <5*  CREATININE 1.02*  < > 0.83 0.73 0.73  CALCIUM 9.2  < > 7.5* 7.6* 7.8*  PROT 8.2*  --   --   --   --   BILITOT 0.8  --   --   --   --   ALKPHOS 166*  --   --   --    --   ALT 42  --   --   --   --   AST 65*  --   --   --   --   GLUCOSE 431*  < > 254* 219* 194*  < > = values in this interval not displayed.   Imaging/Diagnostic Tests: Ct Abdomen Pelvis W Contrast  10/03/2015  CLINICAL DATA:  45 year old female inpatient admitted with acute pyelonephritis, on antibiotics. Persistent right flank pain and fever. EXAM: CT ABDOMEN AND PELVIS WITH CONTRAST TECHNIQUE: Multidetector CT imaging of the abdomen and pelvis was performed using the standard protocol following bolus administration of intravenous contrast. CONTRAST:  75 cc ISOVUE-300 IOPAMIDOL (ISOVUE-300) INJECTION 61% COMPARISON:  09/30/2015 CT abdomen/ pelvis. FINDINGS: Lower chest: No significant pulmonary nodules or acute consolidative airspace disease. Oral contrast is present in the lower thoracic esophagus. Hepatobiliary: Diffuse hepatic steatosis. No liver mass. No definite liver surface irregularity. Normal gallbladder with no radiopaque cholelithiasis. No biliary ductal dilatation. Pancreas: Normal, with no mass or duct dilation. Spleen: Normal size. No mass. Adrenals/Urinary Tract: Right adrenal 2.0 cm nodule is not appreciably changed in size since 02/14/2015 using similar measurement technique. No left adrenal nodule. No hydronephrosis. Bilateral mild perinephric fat stranding, not definitely changed. Persistent diffuse patchiness of the bilateral contrast nephrograms, not appreciably changed. Stable fat stranding surrounding the bilateral ureters. No renal mass or discrete renal fluid collection. No renal or perinephric gas. Normal bladder. Stomach/Bowel: Grossly normal stomach. Normal caliber small bowel with no small bowel wall thickening. Normal appendix. Normal large bowel with no diverticulosis, large bowel wall thickening or pericolonic fat stranding. Vascular/Lymphatic: Normal caliber abdominal aorta. Patent portal, splenic, hepatic and renal veins. No pathologically enlarged lymph nodes in the  abdomen or pelvis. Reproductive: Status post hysterectomy, with no abnormal findings at the hysterectomy margin. No adnexal mass. Other: No pneumoperitoneum, ascites or focal fluid collection. Musculoskeletal: No aggressive appearing focal osseous lesions. Moderate degenerative changes in the visualized thoracolumbar spine. IMPRESSION: 1. Persistent CT findings of bilateral acute pyelonephritis. No evidence of a discrete drainable renal or perinephric fluid collection. No evidence of emphysematous pyelonephritis. No renal vein thrombosis. 2. Right adrenal nodule, indeterminate by density, stable in size since 02/14/2015, suggesting a benign adenoma. A follow-up adrenal mass protocol CT or MRI abdomen without and with IV contrast is recommended in 12 months to document stability. This recommendation follows ACR consensus guidelines: Managing Incidental Findings on Abdominal CT: White Paper of the ACR Incidental Findings Committee. J Am Coll Radiol 2010;7:754-773 3. Diffuse hepatic steatosis. 4. Oral contrast in the lower thoracic esophagus, suggesting esophageal dysmotility and/or gastroesophageal reflux. Electronically Signed   By: Delbert PhenixJason A Poff M.D.   On: 10/03/2015 11:29     Beaulah Dinninghristina M Gambino, MD 10/04/2015,  7:15 AM PGY-1, [facility] FPTS Intern pager: [phone], text pages welcome

## 2015-10-05 ENCOUNTER — Other Ambulatory Visit: Payer: Self-pay | Admitting: Family Medicine

## 2015-10-05 MED ORDER — INSULIN GLARGINE 300 UNIT/ML ~~LOC~~ SOPN: 55.0000 [IU] | PEN_INJECTOR | Freq: Every day | SUBCUTANEOUS | Status: DC

## 2015-10-05 MED FILL — !NOVOLOG 100UNITS/ML VIAL: 100/ML | 60 days supply | Qty: 10 | Fill #2

## 2015-10-05 MED FILL — !TOUJEO SOLOSTAR 300 UNITS/: 300/ML | 25 days supply | Qty: 3 | Fill #0

## 2015-10-14 ENCOUNTER — Inpatient Hospital Stay: Payer: No Typology Code available for payment source | Admitting: Internal Medicine

## 2015-10-17 ENCOUNTER — Encounter: Payer: Self-pay | Admitting: Internal Medicine

## 2015-10-17 ENCOUNTER — Ambulatory Visit (INDEPENDENT_AMBULATORY_CARE_PROVIDER_SITE_OTHER): Payer: Self-pay | Admitting: Internal Medicine

## 2015-10-17 VITALS — BP 120/70 | HR 68 | Temp 97.8°F | Ht <= 58 in | Wt 244.0 lb

## 2015-10-17 DIAGNOSIS — N12 Tubulo-interstitial nephritis, not specified as acute or chronic: Secondary | ICD-10-CM

## 2015-10-17 NOTE — Patient Instructions (Signed)
[facility] with [facility] You will need to get blood work there as part of your hospital follow up. Please follow up with your diabetes as needed.

## 2015-10-17 NOTE — Assessment & Plan Note (Signed)
Follow up for hospital admission. Completed full course of antibiotics no issues Patient denies any residual symptoms other than fatigue. Discussed that this may take sometime to resolve.  - Per discharge summary, patient needs labs, she would like to get these labs at [facility], her normal care providers.  - She plans to follow up with them in the next week. -- for labs and to follow up her diabetes

## 2015-10-17 NOTE — Progress Notes (Signed)
   [facility] Mandy [name] [name] [name], MD Phone: [phone]  Reason For Visit: Hospital F/U - admitted and treated for pyelonephritis   # Patient finished course of amoxicillin at discharge.  Denies missing any days of the medication. States she has had some stomach upset and nausea, thinks it is from the medicine. Patient denies any issues with nauseas since stopping the amoxicillin. No pain with urination, no pelvic pain, or fever or chills. She states she still feels fatigued at times but otherwise feels back to normal.  She actually follows with  [facility], these are her normal providers. Discharge summary reviewed.   Past Medical History Reviewed problem list.  Medications- reviewed and updated  Objective: BP 120/70 mmHg  Pulse 68  Temp(Src) 97.8 F (36.6 C) (Oral)  Ht 4\' 8"  (1.422 m)  Wt 244 lb (110.678 kg)  BMI 54.73 kg/m2  SpO2 97% Gen: NAD, alert, cooperative with exam CV: RRR, good S1/S2, no murmur, Abd: SNTND, BS present, no guarding or organomegaly, no CVA tenderness  Assessment/Plan: See problem based a/p   Pyelonephritis Follow up for hospital admission. Completed full course of antibiotics no issues Patient denies any residual symptoms other than fatigue. Discussed that this may take sometime to resolve.  - Per discharge summary, patient needs labs, she would [facility] providers.  - She plans to follow up with them in the next week. -- for labs and to follow up her diabetes

## 2016-01-05 ENCOUNTER — Observation Stay (HOSPITAL_COMMUNITY): Payer: Self-pay

## 2016-01-05 ENCOUNTER — Encounter (HOSPITAL_COMMUNITY): Payer: Self-pay | Admitting: Emergency Medicine

## 2016-01-05 ENCOUNTER — Inpatient Hospital Stay (HOSPITAL_COMMUNITY)
Admission: EM | Admit: 2016-01-05 | Discharge: 2016-01-08 | DRG: 872 | Disposition: A | Discharge status: Home / Self Care | Payer: Self-pay | Attending: Family Medicine | Admitting: Family Medicine

## 2016-01-05 DIAGNOSIS — E669 Obesity, unspecified: Secondary | ICD-10-CM | POA: Diagnosis present

## 2016-01-05 DIAGNOSIS — B9620 Unspecified Escherichia coli [E. coli] as the cause of diseases classified elsewhere: Secondary | ICD-10-CM | POA: Diagnosis present

## 2016-01-05 DIAGNOSIS — E785 Hyperlipidemia, unspecified: Secondary | ICD-10-CM | POA: Diagnosis present

## 2016-01-05 DIAGNOSIS — A419 Sepsis, unspecified organism: Principal | ICD-10-CM | POA: Diagnosis present

## 2016-01-05 DIAGNOSIS — Z794 Long term (current) use of insulin: Secondary | ICD-10-CM

## 2016-01-05 DIAGNOSIS — E1165 Type 2 diabetes mellitus with hyperglycemia: Secondary | ICD-10-CM | POA: Diagnosis present

## 2016-01-05 DIAGNOSIS — D696 Thrombocytopenia, unspecified: Secondary | ICD-10-CM | POA: Diagnosis present

## 2016-01-05 DIAGNOSIS — N12 Tubulo-interstitial nephritis, not specified as acute or chronic: Secondary | ICD-10-CM | POA: Diagnosis present

## 2016-01-05 DIAGNOSIS — E871 Hypo-osmolality and hyponatremia: Secondary | ICD-10-CM | POA: Diagnosis present

## 2016-01-05 DIAGNOSIS — E119 Type 2 diabetes mellitus without complications: Secondary | ICD-10-CM

## 2016-01-05 DIAGNOSIS — I10 Essential (primary) hypertension: Secondary | ICD-10-CM | POA: Diagnosis present

## 2016-01-05 LAB — URINALYSIS, ROUTINE W REFLEX MICROSCOPIC
GLUCOSE, UA: 250 mg/dL — AB
KETONES UR: 40 mg/dL — AB
Nitrite: POSITIVE — AB
PROTEIN: 100 mg/dL — AB
Specific Gravity, Urine: 1.021 (ref 1.005–1.030)
pH: 6 (ref 5.0–8.0)

## 2016-01-05 LAB — CBC
HCT: 44 % (ref 36.0–46.0)
Hemoglobin: 15.1 g/dL — ABNORMAL HIGH (ref 12.0–15.0)
MCH: 30.5 pg (ref 26.0–34.0)
MCHC: 34.3 g/dL (ref 30.0–36.0)
MCV: 88.9 fL (ref 78.0–100.0)
PLATELETS: 191 10*3/uL (ref 150–400)
RBC: 4.95 MIL/uL (ref 3.87–5.11)
RDW: 12.9 % (ref 11.5–15.5)
WBC: 11.1 10*3/uL — ABNORMAL HIGH (ref 4.0–10.5)

## 2016-01-05 LAB — URINE MICROSCOPIC-ADD ON

## 2016-01-05 LAB — I-STAT BETA HCG BLOOD, ED (MC, WL, AP ONLY)

## 2016-01-05 LAB — BASIC METABOLIC PANEL
Anion gap: 9 (ref 5–15)
BUN: 8 mg/dL (ref 6–20)
CALCIUM: 8.9 mg/dL (ref 8.9–10.3)
CHLORIDE: 97 mmol/L — AB (ref 101–111)
CO2: 23 mmol/L (ref 22–32)
CREATININE: 0.81 mg/dL (ref 0.44–1.00)
GFR calc non Af Amer: >60 mL/min (ref >60)
GLUCOSE: 315 mg/dL — AB (ref 65–99)
Potassium: 4.2 mmol/L (ref 3.5–5.1)
Sodium: 129 mmol/L — ABNORMAL LOW (ref 135–145)

## 2016-01-05 LAB — I-STAT CG4 LACTIC ACID, ED: Lactic Acid, Venous: 2.36 mmol/L (ref 0.5–1.9)

## 2016-01-05 LAB — GLUCOSE, CAPILLARY: Glucose-Capillary: 326 mg/dL — ABNORMAL HIGH (ref 65–99)

## 2016-01-05 MED ORDER — SODIUM CHLORIDE 0.9 % IV BOLUS (SEPSIS)
1000.0000 mL | Freq: Once | INTRAVENOUS | Status: AC
  Administered 2016-01-05: 1000 mL via INTRAVENOUS

## 2016-01-05 MED ORDER — SODIUM CHLORIDE 0.9 % IV SOLN
INTRAVENOUS | Status: DC
  Administered 2016-01-06 – 2016-01-07 (×5): via INTRAVENOUS

## 2016-01-05 MED ORDER — DEXTROSE 5 % IV SOLN
2.0000 g | INTRAVENOUS | Status: DC
  Administered 2016-01-06: 2 g via INTRAVENOUS
  Filled 2016-01-05 (×3): qty 2

## 2016-01-05 MED ORDER — KETOROLAC TROMETHAMINE 30 MG/ML IJ SOLN
30.0000 mg | Freq: Once | INTRAMUSCULAR | Status: AC
  Administered 2016-01-05: 30 mg via INTRAVENOUS
  Filled 2016-01-05: qty 1

## 2016-01-05 MED ORDER — ACETAMINOPHEN 325 MG PO TABS
ORAL_TABLET | ORAL | Status: AC
  Filled 2016-01-05: qty 2

## 2016-01-05 MED ORDER — SODIUM CHLORIDE 0.9 % IV BOLUS (SEPSIS)
1000.0000 mL | Freq: Once | INTRAVENOUS | Status: AC
  Administered 2016-01-06: 1000 mL via INTRAVENOUS

## 2016-01-05 MED ORDER — INSULIN ASPART 100 UNIT/ML ~~LOC~~ SOLN
0.0000 [IU] | Freq: Three times a day (TID) | SUBCUTANEOUS | Status: DC
Start: 2016-01-06 — End: 2016-01-09
  Administered 2016-01-06 (×2): 11 [IU] via SUBCUTANEOUS
  Administered 2016-01-06: 15 [IU] via SUBCUTANEOUS
  Administered 2016-01-07 – 2016-01-08 (×6): 7 [IU] via SUBCUTANEOUS

## 2016-01-05 MED ORDER — ACETAMINOPHEN 325 MG PO TABS
650.0000 mg | ORAL_TABLET | Freq: Once | ORAL | Status: AC | PRN
  Administered 2016-01-05: 650 mg via ORAL

## 2016-01-05 MED ORDER — DEXTROSE 5 % IV SOLN
1.0000 g | Freq: Once | INTRAVENOUS | Status: AC
  Administered 2016-01-05: 1 g via INTRAVENOUS
  Filled 2016-01-05: qty 10

## 2016-01-05 MED ORDER — INSULIN ASPART 100 UNIT/ML ~~LOC~~ SOLN
0.0000 [IU] | Freq: Every day | SUBCUTANEOUS | Status: DC
  Administered 2016-01-06: 2 [IU] via SUBCUTANEOUS
  Administered 2016-01-06: 4 [IU] via SUBCUTANEOUS
  Administered 2016-01-07: 2 [IU] via SUBCUTANEOUS
  Administered 2016-01-08: 3 [IU] via SUBCUTANEOUS

## 2016-01-05 MED ORDER — INSULIN GLARGINE 100 UNIT/ML ~~LOC~~ SOLN
25.0000 [IU] | Freq: Every day | SUBCUTANEOUS | Status: DC
  Administered 2016-01-06: 25 [IU] via SUBCUTANEOUS
  Filled 2016-01-05 (×2): qty 0.25

## 2016-01-05 MED ORDER — ACETAMINOPHEN 325 MG PO TABS
650.0000 mg | ORAL_TABLET | Freq: Four times a day (QID) | ORAL | Status: DC | PRN
  Administered 2016-01-06 – 2016-01-07 (×3): 650 mg via ORAL
  Filled 2016-01-05 (×3): qty 2

## 2016-01-05 NOTE — Progress Notes (Signed)
Pharmacy Antibiotic Note  Mandy Garza is a 45 y.o. female admitted on 01/05/2016 with UTI.  Pharmacy has been consulted for Ceftriaxone dosing. Pt does have history of perinephric abscess but U/S tonight is unremarkable.   Plan: -Ceftriaxone 2g IV q24h -F/U urine culture for directed therapy  Temp (24hrs), Avg:100.2 F (37.9 C), Min:97.6 F (36.4 C), Max:102.7 F (39.3 C)   Recent Labs Lab 01/05/16 1735 01/05/16 2137  WBC 11.1*  --   CREATININE 0.81  --   LATICACIDVEN  --  2.36*    CrCl cannot be calculated (Unknown ideal weight.).    No Known Allergies .  Mandy Garza, Mandy Garza 01/05/2016 11:56 PM

## 2016-01-05 NOTE — ED Notes (Signed)
Report given to [facility] unit nurse , transported to ultrasound .

## 2016-01-05 NOTE — ED Triage Notes (Signed)
Pt states "i think I have a kidney infection, i've been cold, not eating, and sleeping for two days. That's what happened before when I had a kidney infection". Pt c/o pain in R flank. Pt states it burns when she pees.

## 2016-01-05 NOTE — ED Provider Notes (Signed)
[facility]-EMERGENCY DEPT Provider Note   CSN: [id number] Arrival date & time: 01/05/16  1712    History   Chief Complaint Chief Complaint  Patient presents with  . Flank Pain    HPI Mandy Garza is a 45 y.o. female.  45 year old female with a history of hypertension, diabetes mellitus, and right perinephric abscess presents to the emergency department for evaluation of right flank pain which began 2 days ago. Symptoms have been waxing and waning in severity and associated with burning dysuria. Patient has taken some ibuprofen for symptoms with little relief. She reports subjective fever stating that she has had some chills and felt warm. She complains of nausea which is intermittent. She has had no emesis. No bowel changes, hematuria. Abdominal surgical history significant for abdominal hysterectomy and cesarean section. She reports sugars of up to 400 at home despite anorexia.      Past Medical History:  Diagnosis Date  . Headache    "weekly" (09/29/2015)  . High cholesterol   . Hypertension   . Obesity   . Pneumonia ~ 2008  . Type II diabetes mellitus [facility])     Patient Active Problem List   Diagnosis Date Noted  . Thrombocytopenia (HCC)   . AKI (acute kidney injury) (HCC) 09/30/2015  . Hyperglycemia 09/29/2015  . Pyelonephritis 09/29/2015  . Sepsis (HCC)   . Arterial hypotension   . Fever, unspecified   . Obesity 04/08/2015  . Elevated liver enzymes 04/08/2015  . Pure hypercholesterolemia 04/08/2015  . Hyperlipidemia 03/11/2015  . Perinephric abscess 02/14/2015  . Poorly controlled diabetes mellitus (HCC) 02/14/2015  . DKA (diabetic ketoacidoses) (HCC) 02/14/2015  . Hyponatremia 02/14/2015  . Essential hypertension 02/14/2015    Past Surgical History:  Procedure Laterality Date  . ABDOMINAL HYSTERECTOMY  03/2002  . CESAREAN SECTION  1998; 2003    OB History    No data available       Home Medications    Prior to Admission medications     Medication Sig Start Date End Date Taking? Authorizing Provider  amoxicillin (AMOXIL) 500 MG capsule Take 1 capsule (500 mg total) by mouth 3 (three) times daily. 10/04/15   Yolande Jolly, MD  atorvastatin (LIPITOR) 40 MG tablet Take 1 tablet (40 mg total) by mouth daily. 04/08/15   Jaclyn Shaggy, MD  insulin aspart (NOVOLOG) 100 UNIT/ML injection Inject 5 Units into the skin 3 (three) times daily with meals. 04/08/15   Jaclyn Shaggy, MD  Insulin Glargine (TOUJEO SOLOSTAR) 300 UNIT/ML SOPN Inject 55 Units into the skin at bedtime. 10/05/15   Jaclyn Shaggy, MD    Family History Family History  Problem Relation Age of Onset  . Diabetes Father   . Hypertension Father     Social History Social History  Substance Use Topics  . Smoking status: Never Smoker  . Smokeless tobacco: Never Used  . Alcohol use No     Allergies   Review of patient's allergies indicates no known allergies.   Review of Systems Review of Systems  Constitutional: Positive for chills and fever (subjective).  Gastrointestinal: Positive for nausea.  Genitourinary: Positive for dysuria and flank pain.  Ten systems reviewed and are negative for acute change, except as noted in the HPI.     Physical Exam Updated Vital Signs BP 116/78 (BP Location: Left Arm)   Pulse 98   Temp 102.7 F (39.3 C) (Oral)   Resp 16   SpO2 97%   Physical Exam  Constitutional: She is oriented to  person, place, and time. She appears well-developed and well-nourished. No distress.  Nontoxic appearing  HENT:  Head: Normocephalic and atraumatic.  Eyes: Conjunctivae and EOM are normal. No scleral icterus.  Neck: Normal range of motion.  Cardiovascular: Normal rate, regular rhythm and intact distal pulses.   Pulmonary/Chest: Effort normal. No respiratory distress. She has no wheezes. She has no rales.  Respirations even and unlabored. Lungs CTAB.  Abdominal: Soft.  Soft morbidly obese abdomen. No focal tenderness appreciated. No  rigidity. Exam is limited secondary to body habitus.   Musculoskeletal: Normal range of motion.  Neurological: She is alert and oriented to person, place, and time.  GCS 15. Ambulatory with steady gait.  Skin: Skin is warm and dry. No rash noted. She is not diaphoretic. No erythema. No pallor.  Psychiatric: She has a normal mood and affect. Her behavior is normal.  Nursing note and vitals reviewed.    ED Treatments / Results  Labs (all labs ordered are listed, but only abnormal results are displayed) Labs Reviewed  URINALYSIS, ROUTINE W REFLEX MICROSCOPIC (NOT AT [facility]) - Abnormal; Notable for the following:       Result Value   Color, Urine AMBER (*)    APPearance CLOUDY (*)    Glucose, UA 250 (*)    Hgb urine dipstick SMALL (*)    Bilirubin Urine SMALL (*)    Ketones, ur 40 (*)    Protein, ur 100 (*)    Nitrite POSITIVE (*)    Leukocytes, UA MODERATE (*)    All other components within normal limits  BASIC METABOLIC PANEL - Abnormal; Notable for the following:    Sodium 129 (*)    Chloride 97 (*)    Glucose, Bld 315 (*)    All other components within normal limits  CBC - Abnormal; Notable for the following:    WBC 11.1 (*)    Hemoglobin 15.1 (*)    All other components within normal limits  URINE MICROSCOPIC-ADD ON - Abnormal; Notable for the following:    Squamous Epithelial / LPF 6-30 (*)    Bacteria, UA MANY (*)    All other components within normal limits  I-STAT CG4 LACTIC ACID, ED - Abnormal; Notable for the following:    Lactic Acid, Venous 2.36 (*)    All other components within normal limits  URINE CULTURE  CULTURE, BLOOD (ROUTINE X 2)  CULTURE, BLOOD (ROUTINE X 2)  I-STAT BETA HCG BLOOD, ED ([facility], [facility], [facility] ONLY)  I-STAT CG4 LACTIC ACID, ED    EKG  EKG Interpretation None       Radiology No results found.  Procedures Procedures (including critical care time)  Medications Ordered in ED Medications  sodium chloride 0.9 % bolus 1,000 mL (not  administered)  acetaminophen (TYLENOL) tablet 650 mg (650 mg Oral Given 01/05/16 1732)  sodium chloride 0.9 % bolus 1,000 mL (1,000 mLs Intravenous New Bag/Given 01/05/16 2145)  cefTRIAXone (ROCEPHIN) 1 g in dextrose 5 % 50 mL IVPB (0 g Intravenous Stopped 01/05/16 2219)  ketorolac (TORADOL) 30 MG/ML injection 30 mg (30 mg Intravenous Given 01/05/16 2147)  sodium chloride 0.9 % bolus 1,000 mL (1,000 mLs Intravenous New Bag/Given 01/05/16 2205)     Initial Impression / Assessment and Plan / ED Course  I have reviewed the triage vital signs and the nursing notes.  Pertinent labs & imaging results that were available during my care of the patient were reviewed by me and considered in my medical decision making (see  chart for details).  Clinical Course    45 year old female with a history of diabetes and right perinephric abscess presents to the emergency department for right flank pain and dysuria. Patient meets sepsis criteria; febrile on arrival and tachycardic. WBC 11.1 and lactate elevated to 2.39. Workup consistent with pyelonephritis. Patient started on IV Rocephin and given IV fluids. Given history of complicated UTI and diabetes, will admit for further management. Case discussed with family medicine. Temporary admission orders placed. Renal US ordered and is pending to r/o repeat perinephric abscess.   Final Clinical Impressions(s) / ED Diagnoses   Final diagnoses:  Pyelonephritis  Sepsis, due to unspecified organism [facility])    New Prescriptions New Prescriptions   No medications on file     Antony Madura, PA-C 01/05/16 2242    Lyndal Pulley, MD 01/06/16 [phone]

## 2016-01-05 NOTE — H&P (Signed)
Family Medicine Teaching [facility] Admission History and Physical Service Pager: [phone]  Patient name: Mandy Garza Medical record number: [id number] Date of birth: 1971/03/20 Age: 45 y.o. Gender: female  Primary Care Provider: Jaclyn Shaggy, MD Consultants: none Code Status: FULL  Chief Complaint: dysuria and R flank pain  Assessment and Plan: Mandy Garza is a 45 y.o. female presenting with dysuria and R flank pain . PMH is significant for hx UTI and perinephric abscess in 02/2015, DM2 poorly controlled, HLD.   Pyelonephritis: History of pyelonephritis x2. Also history of perinephric abscess in 02/2015 which grew GBS. Dysuria and low R back pain similar to prior episodes of pyelonephritis. No significant CVA tenderness on exam. Initially, HR 109, febrile to 102.64F, leukocytosis to 11.1, Lactic acidosis 2.36. SIRS 2/4. qsofa 0. Clinically well appearing. BP stable. HR improved with boluses. 1L bolus x 2 in the ED and receiving third 1L bolus in ED(essentialy 30cc/kg). - admit to telemetry, attending Dr. McDiarmid - CTX x 1 in ED. Most recent urine culture from 09/2015 grew E. Coli pan-sensitive. Therefore, will do CTX per pharmacy. Low threshold to broaden - Renal US ordered by ED with patient's history  - trend lactic acid levels - blood cultures collected in ED - urine culture collected in ED - will start IVF @100cc /hr after boluses are completed - CBC in AM  DM2, poorly controlled: Last A1c 09/2015 15.3. Home regimen: Glargine 50 units at bedtime and Metformin Has novolog 5 units on med list but does not report this.  - will hold Metformin - repeat A1c - Lantus 25 units at bedtime  - resistant SSI - CBG ACHS  HLD: Lipid panel in 03/2015 with cholesterol 306, TAG 295, LDL 196. Med list has Lipitor but patient does not report this. Looking at lipid panel, would benefit from restarting Lipitor - Lipitor 40 daily  FEN/GI: heart healthy carb modified Prophylaxis:  SCDs for now; per chart review, patient had thrombocytopenia during hospitalization in May after starting Lovenox. HIT score was 2. Lovnenox was discontinued and platelets normalized; Platelet antibody testing was not done. Will discuss with AM team regarding prophylaxis.   Disposition: admit for further management   History of Present Illness:  Mandy Garza is a 45 y.o. female presenting with dysuria and right low back pain similar symptoms to prior episode of pyelonephritis.  Patient reports of 3 day history of subjective fevers, dysuria, pain in R lower back, suprapubic discomfort. No gross blood in urine but urine seems "strong". Also reports of decreased energy, decreased appetite, decreased PO fluid intake some nausea but no emesis. No abnormal vaginal discharge. No history of kidney stones. History of pyelonephritis x2. Also history of perinephric abscess in 02/2015 which grew GBS.  History of abdominal hysterectomy.   In the ED, patient was found to be tachycardic to 109 and febrile to 102.7.  patient was given 1 L bolus x 3, tylenol. Fever reduced with tylenol to 97.6. Blood pressures stable. WBC 11.1. UA with mod LE, positive nitrite. Urine microscopy with many bacteria, 6-30 squams.   Review Of Systems: Per HPI Otherwise the remainder of the systems were negative.  Patient Active Problem List   Diagnosis Date Noted  . Thrombocytopenia (HCC)   . AKI (acute kidney injury) (HCC) 09/30/2015  . Hyperglycemia 09/29/2015  . Pyelonephritis 09/29/2015  . Sepsis (HCC)   . Arterial hypotension   . Fever, unspecified   . Obesity 04/08/2015  . Elevated liver enzymes 04/08/2015  . Pure hypercholesterolemia 04/08/2015  .  Hyperlipidemia 03/11/2015  . Perinephric abscess 02/14/2015  . Poorly controlled diabetes mellitus (HCC) 02/14/2015  . DKA (diabetic ketoacidoses) (HCC) 02/14/2015  . Hyponatremia 02/14/2015  . Essential hypertension 02/14/2015    Past Medical History: Past  Medical History:  Diagnosis Date  . Headache    "weekly" (09/29/2015)  . High cholesterol   . Hypertension   . Obesity   . Pneumonia ~ 2008  . Type II diabetes mellitus (HCC)     Past Surgical History: Past Surgical History:  Procedure Laterality Date  . ABDOMINAL HYSTERECTOMY  03/2002  . CESAREAN SECTION  1998; 2003    Social History: Social History  Substance Use Topics  . Smoking status: Never Smoker  . Smokeless tobacco: Never Used  . Alcohol use No  No illicit drug use  Please also refer to relevant sections of EMR.  Family History: Family History  Problem Relation Age of Onset  . Diabetes Father   . Hypertension Father     Allergies and Medications: No Known Allergies No current facility-administered medications on file prior to encounter.    Current Outpatient Prescriptions on File Prior to Encounter  Medication Sig Dispense Refill  . insulin aspart (NOVOLOG) 100 UNIT/ML injection Inject 5 Units into the skin 3 (three) times daily with meals. 10 mL 3  . Insulin Glargine (TOUJEO SOLOSTAR) 300 UNIT/ML SOPN Inject 55 Units into the skin at bedtime. 5 pen 3  . amoxicillin (AMOXIL) 500 MG capsule Take 1 capsule (500 mg total) by mouth 3 (three) times daily. (Patient not taking: Reported on 01/05/2016) 30 capsule 0  . atorvastatin (LIPITOR) 40 MG tablet Take 1 tablet (40 mg total) by mouth daily. (Patient not taking: Reported on 01/05/2016) 30 tablet 2    Objective: BP 126/78 (BP Location: Right Arm)   Pulse 93   Temp 102.7 F (39.3 C) (Oral)   Resp 16   SpO2 98%  Exam: GEN: NAD, pleasant, laughing with daughter, non-toxic appearing; obese HEENT: Atraumatic, normocephalic, neck supple, EOMI, sclera clear. OP unremarkable. Mildly dry mucous membranes.  CV: RRR, no murmurs, rubs, or gallops PULM: CTAB, normal effort ABD: Soft, nontender, nondistended, NABS, no organomegaly Back: reports of mild tenderness in the right lower lumbar area (lower than the usual CVA  region); no significant CVA tenderness SKIN: No rash or cyanosis; warm and well-perfused EXTR: No lower extremity edema or calf tenderness PSYCH: Mood and affect euthymic, normal rate and volume of speech NEURO: Awake, alert, no focal deficits grossly, normal speech   Labs and Imaging: CBC BMET   Recent Labs Lab 01/05/16 1735  WBC 11.1*  HGB 15.1*  HCT 44.0  PLT 191    Recent Labs Lab 01/05/16 1735  NA 129*  K 4.2  CL 97*  CO2 23  BUN 8  CREATININE 0.81  GLUCOSE 315*  CALCIUM 8.9     WBC 11.1. UA with mod LE, positive nitrite. Urine microscopy with many bacteria, 6-30 squams.  Lactic acid: 2.36 istat hcg <5  Palma HolterKanishka G Gunadasa, MD 01/05/2016, 10:38 PM PGY-2, [facility] FPTS Intern pager: [phone], text pages welcome

## 2016-01-06 DIAGNOSIS — Z794 Long term (current) use of insulin: Secondary | ICD-10-CM | POA: Insufficient documentation

## 2016-01-06 DIAGNOSIS — E119 Type 2 diabetes mellitus without complications: Secondary | ICD-10-CM | POA: Insufficient documentation

## 2016-01-06 LAB — CBC
HEMATOCRIT: 38.8 % (ref 36.0–46.0)
Hemoglobin: 12.8 g/dL (ref 12.0–15.0)
MCH: 29.3 pg (ref 26.0–34.0)
MCHC: 33 g/dL (ref 30.0–36.0)
MCV: 88.8 fL (ref 78.0–100.0)
Platelets: 160 10*3/uL (ref 150–400)
RBC: 4.37 MIL/uL (ref 3.87–5.11)
RDW: 13.1 % (ref 11.5–15.5)
WBC: 8.9 10*3/uL (ref 4.0–10.5)

## 2016-01-06 LAB — BASIC METABOLIC PANEL
ANION GAP: 11 (ref 5–15)
BUN: 12 mg/dL (ref 6–20)
CALCIUM: 8 mg/dL — AB (ref 8.9–10.3)
CO2: 23 mmol/L (ref 22–32)
CREATININE: 0.77 mg/dL (ref 0.44–1.00)
Chloride: 100 mmol/L — ABNORMAL LOW (ref 101–111)
GFR calc Af Amer: >60 mL/min (ref >60)
GFR calc non Af Amer: >60 mL/min (ref >60)
GLUCOSE: 308 mg/dL — AB (ref 65–99)
Potassium: 3.9 mmol/L (ref 3.5–5.1)
Sodium: 134 mmol/L — ABNORMAL LOW (ref 135–145)

## 2016-01-06 LAB — GLUCOSE, CAPILLARY
GLUCOSE-CAPILLARY: 296 mg/dL — AB (ref 65–99)
GLUCOSE-CAPILLARY: 266 mg/dL — AB (ref 65–99)
GLUCOSE-CAPILLARY: 336 mg/dL — AB (ref 65–99)
GLUCOSE-CAPILLARY: 234 mg/dL — AB (ref 65–99)

## 2016-01-06 LAB — LACTIC ACID, PLASMA: Lactic Acid, Venous: 1.2 mmol/L (ref 0.5–1.9)

## 2016-01-06 MED ORDER — ATORVASTATIN CALCIUM 40 MG PO TABS
40.0000 mg | ORAL_TABLET | Freq: Every day | ORAL | Status: DC
  Administered 2016-01-06 – 2016-01-08 (×3): 40 mg via ORAL
  Filled 2016-01-06 (×3): qty 1

## 2016-01-06 MED ORDER — IBUPROFEN 200 MG PO TABS
600.0000 mg | ORAL_TABLET | Freq: Once | ORAL | Status: AC
  Administered 2016-01-06: 600 mg via ORAL
  Filled 2016-01-06: qty 3

## 2016-01-06 MED ORDER — INSULIN GLARGINE 100 UNIT/ML ~~LOC~~ SOLN
35.0000 [IU] | Freq: Every day | SUBCUTANEOUS | Status: DC
  Administered 2016-01-06: 35 [IU] via SUBCUTANEOUS
  Filled 2016-01-06 (×2): qty 0.35

## 2016-01-06 NOTE — Progress Notes (Addendum)
Family Medicine Teaching Service Daily Progress Note Intern Pager: [phone]  Patient name: Mandy Garza Medical record number: [id number] Date of birth: 01/17/1971 Age: 45 y.o. Gender: female  Primary Care Provider: Jaclyn ShaggyEnobong, Amao, MD Consultants: pharmacy Code Status: FULL  Pt Overview and Major Events to Date:  Admitted 8/31  Assessment and Plan: Mandy Garza is a 45 y.o. female presenting with dysuria and R flank pain . PMH is significant for hx UTI and perinephric abscess in 02/2015.   Pyelonephritis: History of pyelonephritis x2. Also history of perinephric abscess in 02/2015 which grew GBS. Dysuria and low R back pain similar to prior episodes of pyelonephritis. No significant CVA tenderness on exam. Initially, HR 109, febrile to 102.26F, leukocytosis to 11.1, Lactic acidosis 2.36. SIRS 2/4. qsofa 0. Clinically well appearing. BP stable. HR improved with boluses. 1L bolus x 3 in the ED. - admit to telemetry, attending Dr. McDiarmid - CTX x 1 in ED. Most recent urine culture from 09/2015 grew E. Coli pan-sensitive. Therefore, will do CTX per pharmacy. Low threshold to broaden - Renal US negative - trend lactic acid levels 2.36 --> 1.2 - blood cultures pending - urine culture pending - continue IVF @100cc /hr  - CBC today WBC down to 8.9 - if remains febrile tonight, will recollect a blood culture and broaden antibiotic coverage to include zosyn  DM2, poorly controlled: Last A1c 09/2015 15.3. Home regimen: Glargine 50 units at bedtime and Metformin Has novolog 5 units on med list but does not report this.  - will hold Metformin - repeat A1c pending - increase Lantus to 35 units at bedtime  - resistant SSI - CBG ACHS  HLD: Lipid panel in 03/2015 with cholesterol 306, TAG 295, LDL 196. Med list has Lipitor but patient does not report this. Looking at lipid panel, would benefit from restarting Lipitor - Lipitor 40 daily  FEN/GI: heart healthy carb  modified Prophylaxis: SCDs; per chart review, patient had thrombocytopenia during hospitalization in May after starting Lovenox. HIT score was 2. Lovnenox was discontinued and platelets normalized; Platelet antibody testing was not done.  Disposition: home pending clinical improvement  Subjective:  Feels ok today, was able to rest overnight. Still reports chills/fevers, has pain on right side. Is confused as to why she keeps getting infections. Thought it was due to controlled diabetes but says she has been trying to take her diabetes medications as prescribed.  Objective: Temp:  [97.6 F (36.4 C)-102.9 F (39.4 C)] 102.9 F (39.4 C) (09/01 0637) Pulse Rate:  [83-109] 100 (09/01 0637) Resp:  [14-16] 15 (09/01 0637) BP: (96-141)/(51-98) 126/79 (09/01 0637) SpO2:  [92 %-98 %] 93 % (09/01 21300637) Physical Exam: General: obese female, laying in bed, in no acute distress  Cardiovascular: regular rate and rhythm no murmurs rubs or gallops Respiratory: clear to auscultation bilaterally no increased work of breathin Abdomen: soft, tender to palpation of right flank Extremities: warm, well perfused, no edema or cyanosis  Laboratory:  Recent Labs Lab 01/05/16 1735 01/06/16 0454  WBC 11.1* 8.9  HGB 15.1* 12.8  HCT 44.0 38.8  PLT 191 160    Recent Labs Lab 01/05/16 1735 01/06/16 0454  NA 129* 134*  K 4.2 3.9  CL 97* 100*  CO2 23 23  BUN 8 12  CREATININE 0.81 0.77  CALCIUM 8.9 8.0*  GLUCOSE 315* 308*     Imaging/Diagnostic Tests: none  Tillman Sersngela C Mattelyn Imhoff, DO 01/06/2016, 7:17 AM PGY-1, [facility] FPTS Intern pager: [phone], text pages welcome

## 2016-01-06 NOTE — Progress Notes (Signed)
Spoke with patient and family member about her diabetes. States that she was diagnosed about 4 years ago.  [facility]. Asked her what insulin she takes at home. The family member states that it is a "purple" pen; does not know the brand name.  She takes 50 units of the medication.  [facility]. She does check blood sugars at home X2 per day.  [facility] that they do not carry a "purple" pen. The patient's record shows that she had Lantus 50 units daily in February, but Toujeo 55 units since May. Not sure what she is taking.  Will continue to monitor blood sugars while in the hospital. Smith MinceKendra Marian Grandt RN BSN CDE

## 2016-01-07 ENCOUNTER — Encounter (HOSPITAL_COMMUNITY): Payer: Self-pay

## 2016-01-07 LAB — HEMOGLOBIN A1C
Hgb A1c MFr Bld: 11.8 % — ABNORMAL HIGH (ref 4.8–5.6)
Mean Plasma Glucose: 292 mg/dL

## 2016-01-07 LAB — GLUCOSE, CAPILLARY
GLUCOSE-CAPILLARY: 247 mg/dL — AB (ref 65–99)
GLUCOSE-CAPILLARY: 226 mg/dL — AB (ref 65–99)
Glucose-Capillary: 215 mg/dL — ABNORMAL HIGH (ref 65–99)
Glucose-Capillary: 225 mg/dL — ABNORMAL HIGH (ref 65–99)

## 2016-01-07 LAB — URINE CULTURE: Culture: >=100000 — AB

## 2016-01-07 MED ORDER — IBUPROFEN 200 MG PO TABS
400.0000 mg | ORAL_TABLET | Freq: Four times a day (QID) | ORAL | Status: DC | PRN
  Administered 2016-01-07 – 2016-01-08 (×2): 400 mg via ORAL
  Filled 2016-01-07 (×2): qty 2

## 2016-01-07 MED ORDER — IBUPROFEN 100 MG/5ML PO SUSP
400.0000 mg | Freq: Four times a day (QID) | ORAL | Status: DC | PRN
  Filled 2016-01-07: qty 20

## 2016-01-07 MED ORDER — INSULIN GLARGINE 100 UNIT/ML ~~LOC~~ SOLN
40.0000 [IU] | Freq: Every day | SUBCUTANEOUS | Status: DC
  Administered 2016-01-07 – 2016-01-08 (×2): 40 [IU] via SUBCUTANEOUS
  Filled 2016-01-07 (×2): qty 0.4

## 2016-01-07 MED ORDER — CEPHALEXIN 500 MG PO CAPS
500.0000 mg | ORAL_CAPSULE | Freq: Two times a day (BID) | ORAL | Status: DC
  Administered 2016-01-07 – 2016-01-08 (×3): 500 mg via ORAL
  Filled 2016-01-07 (×3): qty 1

## 2016-01-07 MED ORDER — IBUPROFEN 100 MG/5ML PO SUSP
400.0000 mg | Freq: Four times a day (QID) | ORAL | Status: DC
  Filled 2016-01-07: qty 20

## 2016-01-07 NOTE — Progress Notes (Signed)
  Family Medicine Teaching Service Daily Progress Note Intern Pager: [phone]  Patient name: Mandy Garza Medical record number: [id number] Date of birth: 01/28/1971 Age: 45 y.o. Gender: female  Primary Care Provider: Jaclyn ShaggyEnobong, Amao, MD Consultants: pharmacy Code Status: FULL  Pt Overview and Major Events to Date:  Admitted 8/31 for pyelonephritis  9/1: Urine Cx with >100,000 colonies E coli   Assessment and Plan: Mandy Garza is a 45 y.o. female presenting with dysuria and R flank pain . PMH is significant for hx UTI and perinephric abscess in 02/2015.   Pyelonephritis: Rena; US negative. Urine culture >100,000 E coli and pan-sensitive. Patient mildly febrile to 100.8 this AM but clinically stable. Blood cultures NGTD. Without leukocytosis on 9/1.  - transition from CTX (9/1-9-2) to Keflex 500 mg BID  - f/u blood cultures  - urine culture pending - continue IVF @100cc /hr   DM2, poorly controlled: Last A1c 09/2015 15.3. Home regimen: Glargine 50 units at bedtime and Metformin Has novolog 5 units on med list but does not report this. HgB A1C 11.8.  - will hold Metformin - increase Lantus to 40 units at bedtime  - resistant SSI - CBG ACHS  HLD: Lipid panel in 03/2015 with cholesterol 306, TAG 295, LDL 196. Med list has Lipitor but patient does not report this. Looking at lipid panel, would benefit from restarting Lipitor - Lipitor 40 daily  FEN/GI: heart healthy carb modified Prophylaxis: SCDs; per chart review, patient had thrombocytopenia during hospitalization in May after starting Lovenox. HIT score was 2. Lovnenox was discontinued and platelets normalized; Platelet antibody testing was not done.  Disposition: home pending clinical improvement  Subjective:  Doing better this morning. Still with some lower left sided flank   Objective: Temp:  [97.8 F (36.6 C)-102.8 F (39.3 C)] 100.8 F (38.2 C) (09/02 0708) Pulse Rate:  [56-99] 84 (09/02 0500) Resp:   [16-18] 17 (09/02 0500) BP: (91-127)/(52-66) 101/58 (09/02 0500) SpO2:  [95 %-99 %] 96 % (09/02 0500) Physical Exam: General: obese female, laying in bed, in no acute distress  Cardiovascular: regular rate and rhythm no murmurs rubs or gallops Respiratory: clear to auscultation bilaterally no increased work of breathin Abdomen: +BS, soft, tender to palpation of left flank, no CVA tenderness Extremities: warm, well perfused, no edema or cyanosis  Laboratory:  Recent Labs Lab 01/05/16 1735 01/06/16 0454  WBC 11.1* 8.9  HGB 15.1* 12.8  HCT 44.0 38.8  PLT 191 160    Recent Labs Lab 01/05/16 1735 01/06/16 0454  NA 129* 134*  K 4.2 3.9  CL 97* 100*  CO2 23 23  BUN 8 12  CREATININE 0.81 0.77  CALCIUM 8.9 8.0*  GLUCOSE 315* 308*   [place] Renal  Result Date: 01/05/2016 CLINICAL DATA:  Right flank pain.  Duration 2 days. EXAM: RENAL / URINARY TRACT ULTRASOUND COMPLETE COMPARISON:  None. FINDINGS: Right Kidney: Length: 12.4 cm. Echogenicity within normal limits. No mass or hydronephrosis visualized. Left Kidney: Length: 13.3 cm. Echogenicity within normal limits. No mass or hydronephrosis visualized. Bladder: Appears normal for degree of bladder distention. IMPRESSION: Normal size, thickness and echotexture bilaterally. No hydronephrosis. Electronically Signed   By: Ellery Plunkaniel R Mitchell M.D.   On: 01/05/2016 23:44   Mandy Marketatherine Lauren Princella Jaskiewicz, DO 01/07/2016, 9:21 AM PGY-2,  Family Medicine FPTS Intern pager: [phone], text pages welcome

## 2016-01-08 ENCOUNTER — Encounter (HOSPITAL_COMMUNITY): Payer: Self-pay | Admitting: *Deleted

## 2016-01-08 LAB — GLUCOSE, CAPILLARY
GLUCOSE-CAPILLARY: 219 mg/dL — AB (ref 65–99)
GLUCOSE-CAPILLARY: 269 mg/dL — AB (ref 65–99)
Glucose-Capillary: 214 mg/dL — ABNORMAL HIGH (ref 65–99)
Glucose-Capillary: 243 mg/dL — ABNORMAL HIGH (ref 65–99)

## 2016-01-08 MED ORDER — INSULIN GLARGINE 300 UNIT/ML ~~LOC~~ SOPN: 50.0000 [IU] | PEN_INJECTOR | Freq: Every day | SUBCUTANEOUS | 3 refills | Status: DC

## 2016-01-08 MED ORDER — CEPHALEXIN 500 MG PO CAPS: 500.0000 mg | ORAL_CAPSULE | Freq: Three times a day (TID) | ORAL | 0 refills | Status: DC

## 2016-01-08 MED ORDER — CEPHALEXIN 500 MG PO CAPS
500.0000 mg | ORAL_CAPSULE | Freq: Three times a day (TID) | ORAL | Status: DC
  Administered 2016-01-08 (×2): 500 mg via ORAL
  Filled 2016-01-08 (×2): qty 1

## 2016-01-08 NOTE — Discharge Instructions (Signed)
Please make an appointment in 1 week to follow up with your PCP to check on your insulin regimen and to make sure your symptoms are resolving.  Take the Keflex THREE times per day for 14 total days, to end on 9/13, for your UTI.  Call your doctor or come to the emergency room if you have continued fevers, abdominal pain, nausea and vomiting, or symptoms of low blood sugar like dizziness, feeling faint, or excessive sweating.

## 2016-01-08 NOTE — Progress Notes (Signed)
Family Medicine Teaching Service Daily Progress Note Intern Pager: [phone]  Patient name: Mandy Garza Medical record number: [id number] Date of birth: 03/07/1971 Age: 45 y.o. Gender: female  Primary Care Provider: Jaclyn ShaggyEnobong, Amao, MD Consultants: pharmacy Code Status: FULL  Pt Overview and Major Events to Date:  Admitted 8/31 for pyelonephritis  9/1: Urine Cx with >100,000 colonies E coli, pan sensitive  Assessment and Plan: Mandy Girtelli Mcbee is a 45 y.o. female presenting with dysuria and R flank pain . PMH is significant for hx UTI and perinephric abscess in 02/2015.   Pyelonephritis: Rena; US negative. Urine culture >100,000 E coli and pan-sensitive. Patient mildly febrile to 100.8 this AM but clinically stable. Blood cultures NGTD. Without leukocytosis on 9/1. Pt febrile 0700 9/2, prior to receiving Keflex, and again at 0700 9/3. Per pharmacy, good renal function could mean that pt is clearing Keflex too quickly, will increase to TID. - transition from CTX (9/1-9-2) to Keflex 500 mg TID  - f/u blood cultures  - urine culture as above  DM2, poorly controlled: Last A1c 09/2015 15.3. Home regimen: Glargine 50 units at bedtime and Metformin Has novolog 5 units on med list but does not report this. HgB A1C 11.8.  - will hold Metformin - increase Lantus to 40 units at bedtime  - resistant SSI - CBG ACHS  HLD: Lipid panel in 03/2015 with cholesterol 306, TAG 295, LDL 196. Med list has Lipitor but patient does not report this. Looking at lipid panel, would benefit from restarting Lipitor - Lipitor 40 daily  FEN/GI: heart healthy carb modified Prophylaxis: SCDs; per chart review, patient had thrombocytopenia during hospitalization in May after starting Lovenox. HIT score was 2. Lovnenox was discontinued and platelets normalized; Platelet antibody testing was not done.  Disposition: home pending clinical improvement  Subjective:  Denies any pain this morning, reports pain in  her legs bilaterally in the recent past when standing >3 hrs. Recommended that she discuss this with PCP, will put in d/c summary.  Objective: Temp:  [97.7 F (36.5 C)-100.7 F (38.2 C)] 97.7 F (36.5 C) (09/03 0926) Pulse Rate:  [73-80] 78 (09/03 0656) Resp:  [16-17] 16 (09/03 0656) BP: (80-124)/(45-63) 109/63 (09/03 0656) SpO2:  [96 %-100 %] 96 % (09/03 0656) Physical Exam: General: obese female, laying in bed, in no acute distress  Cardiovascular: regular rate and rhythm no murmurs rubs or gallops Respiratory: clear to auscultation bilaterally no increased work of breathin Abdomen: +BS, soft, nontender, no CVA tenderness Extremities: warm, well perfused, no edema or cyanosis  Laboratory:  Recent Labs Lab 01/05/16 1735 01/06/16 0454  WBC 11.1* 8.9  HGB 15.1* 12.8  HCT 44.0 38.8  PLT 191 160    Recent Labs Lab 01/05/16 1735 01/06/16 0454  NA 129* 134*  K 4.2 3.9  CL 97* 100*  CO2 23 23  BUN 8 12  CREATININE 0.81 0.77  CALCIUM 8.9 8.0*  GLUCOSE 315* 308*   [place] Renal  Result Date: 01/05/2016 CLINICAL DATA:  Right flank pain.  Duration 2 days. EXAM: RENAL / URINARY TRACT ULTRASOUND COMPLETE COMPARISON:  None. FINDINGS: Right Kidney: Length: 12.4 cm. Echogenicity within normal limits. No mass or hydronephrosis visualized. Left Kidney: Length: 13.3 cm. Echogenicity within normal limits. No mass or hydronephrosis visualized. Bladder: Appears normal for degree of bladder distention. IMPRESSION: Normal size, thickness and echotexture bilaterally. No hydronephrosis. Electronically Signed   By: Ellery Plunkaniel R Mitchell M.D.   On: 01/05/2016 23:44   Garth BignessKathryn Kathya Wilz, MD 01/08/2016, 11:55 AM PGY-1, [facility] Intern pager: [phone], text pages welcome

## 2016-01-08 NOTE — Discharge Summary (Signed)
[facility]  Patient name: Mandy Garza Medical record number: [id number] Date of birth: 12/29/1970 Age: 45 y.o. Gender: female Date of Admission: 01/05/2016  Date of Discharge: 01/08/16  Admitting Physician: Leighton Roachodd D McDiarmid, MD  Primary Care Provider: Jaclyn ShaggyEnobong, Amao, MD Consultants: none  Indication for Hospitalization: pyelonephritis  Discharge Diagnoses/Problem List:  pyelonephritis  Disposition: home  Discharge Condition: stable, improved  Discharge Exam:  General: obese female, laying in bed, in no acute distress              Cardiovascular: regular rate and rhythm no murmurs rubs or gallops Respiratory: clear to auscultation bilaterally no increased work of breathin Abdomen: +BS, soft, nontender, no CVA tenderness Extremities: warm, well perfused, no edema or cyanosis  Brief Hospital Course:   Pyelonephritis  Ms. Jasso [facility] 01[facility]05[facility]16 with dysuria, right flank pain, fever to 102.7, tachycardia to 109, wbc of 11.1, urinalysis positive for nitrite, moderate LE, and man bacteria. Lactic acid was 2.36. Patient was meeting SIRS criteria 2/4. She was started on Ceftriaxone empirically and given 3 one litre boluses in the ED. Tachycardia improved with fluid resuscitation. [facility] to receive IV antibiotics and IV fluids. Lactic acid trended down to 1.2. Patient remained febrile on 9/1 but fever curve was trending downward. Urine culture grew E. Coli that was pan-sensitive. Patient was transitioned to PO Keflex 500 mg BID on 9/2. She continued to have low grade fevers the morning of 01/08/16 and pharmacy recommended increasing Keflex to 500 mg three times a day. Patient remained afebrile throughout the rest of the day on 9/3 and was discharged home with a 10 day course of Keflex 500 mg three times a day.   T2DM Last A1C from  May 2017 was 15.3. Patients metformin was held during hospitalization. She received Lantus at bedtime and resistant SSI. A recheck of A1C came back as 11.8. She was discharged on her home regimen of Glargine 50 units at bedtime and Metformin 500mg  four times daily.  Hyperlipidemia Home medication list indicated patient was to be taking Lipitor 40 mg daily but she did not report doing so at time of admission. Last Lipid panel in November 2016 indicated total cholesterol of 306, LDL of 196. Restarted Lipitor 40 mg during hospital stay.   Issues for Follow Up:  1. Resolution of dysuria, completion of Kelfex course 2. Poorly controlled diabetes, A1C of 1 3. Hyperlipidemia patient was not taking Lipitor prior to hospitalization  4. Pain in legs with prolonged standing  Significant Procedures: none  Significant Labs and Imaging:   Recent Labs Lab 01/05/16 1735 01/06/16 0454  WBC 11.1* 8.9  HGB 15.1* 12.8  HCT 44.0 38.8  PLT 191 160    Recent Labs Lab 01/05/16 1735 01/06/16 0454  NA 129* 134*  K 4.2 3.9  CL 97* 100*  CO2 23 23  GLUCOSE 315* 308*  BUN 8 12  CREATININE 0.81 0.77  CALCIUM 8.9 8.0*   A1C 11.8  Renal ultrasound 01/06/16: Normal size, thickness and echotexture bilaterally. No hydronephrosis.  Results/Tests Pending at Time of Discharge: none  Discharge Medications:    Medication List    STOP taking these medications   amoxicillin 500 MG capsule Commonly known as:  AMOXIL   insulin aspart 100 UNIT/ML injection Commonly known as:  NOVOLOG     TAKE these medications   atorvastatin 40 MG tablet Commonly known  as:  LIPITOR Take 1 tablet (40 mg total) by mouth daily.   cephALEXin 500 MG capsule Commonly known as:  KEFLEX Take 1 capsule (500 mg total) by mouth 3 (three) times daily.   Insulin Glargine 300 UNIT/ML Sopn Commonly known as:  TOUJEO SOLOSTAR Inject 50 Units into the skin at bedtime. What changed:  how much to take   metFORMIN 500 MG  tablet Commonly known as:  GLUCOPHAGE Take 500 mg by mouth 4 (four) times daily.       Discharge Instructions: Please refer to Patient Instructions section of EMR for full details.  Patient was counseled important signs and symptoms that should prompt return to medical care, changes in medications, dietary instructions, activity restrictions, and follow up appointments.   Follow-Up Appointments: Follow-up Information    Jaclyn Shaggy, MD. Schedule an appointment as soon as possible for a visit in 1 week(s).   Specialty:  Family Medicine Why:  Hospital followup Contact information: [address] [place] [address] [phone]           Tillman Sers, DO 01/08/2016, 9:44 PM PGY-1, [facility]

## 2016-01-09 NOTE — Progress Notes (Signed)
Pt discharged per MD order. Discharge orders read to Pt, understood, signed, and placed in chart. Pt reports ride is waiting downstairs in main entrance. Upon being wheeled by NT to main entrance, Pt's ride did not show up and Pt drove herself and daughter home.

## 2016-01-10 LAB — CULTURE, BLOOD (ROUTINE X 2)
CULTURE: NO GROWTH
Culture: NO GROWTH

## 2016-01-10 LAB — GLUCOSE, CAPILLARY: GLUCOSE-CAPILLARY: 232 mg/dL — AB (ref 65–99)

## 2016-06-27 IMAGING — CT CT ABD-PELV W/ CM
3 of 4 series · 13 of 36 positions shown, 19 images · IV contrast ([ID] ISOVUE 300)
Comparison: 02/14/2015

CLINICAL DATA: Perinephric abscess

EXAM:
CT ABDOMEN AND PELVIS WITH CONTRAST
TECHNIQUE: Multidetector CT imaging of the abdomen and pelvis was performed
using the standard protocol following bolus administration of
intravenous contrast.
CONTRAST:  125mL C8PR0O-M99 IOPAMIDOL (C8PR0O-M99) INJECTION 61%

[Series 3: abd/pelvis · axial · 0.98mm/px · z∈[-437,-52]mm · 8 of 99 slices shown, 13 images]
[im 11/99  soft-tissue]
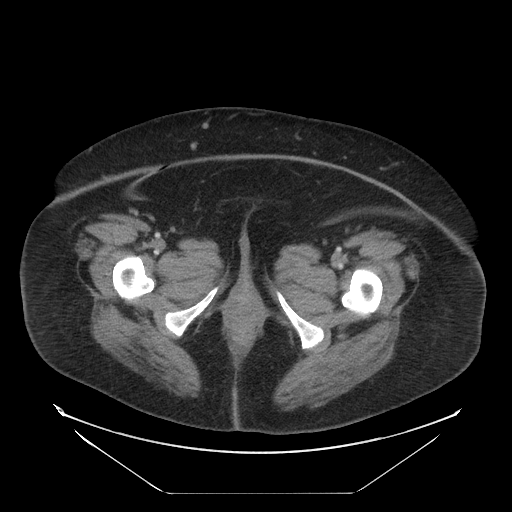
[im 11/99  bone]
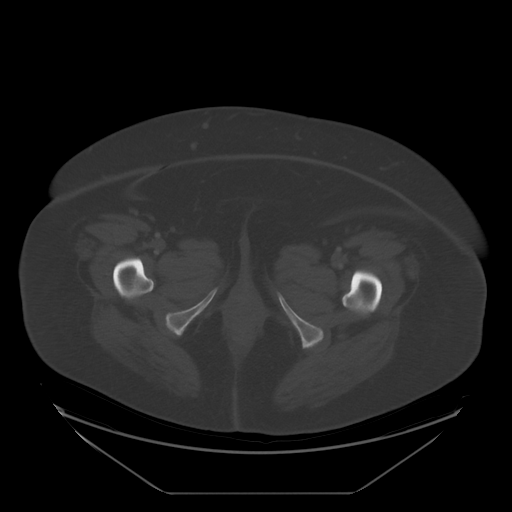
[im 22/99  soft-tissue]
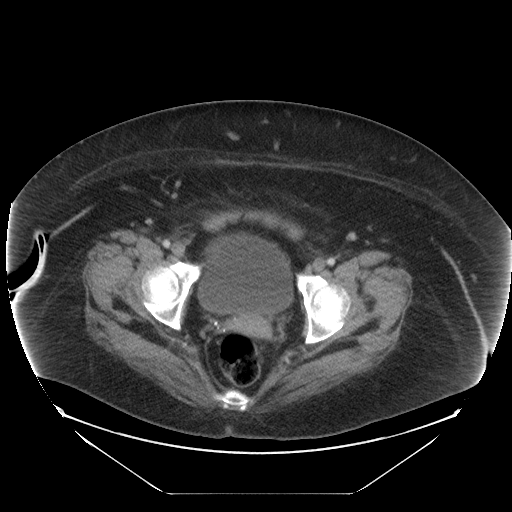
[im 33/99  soft-tissue]
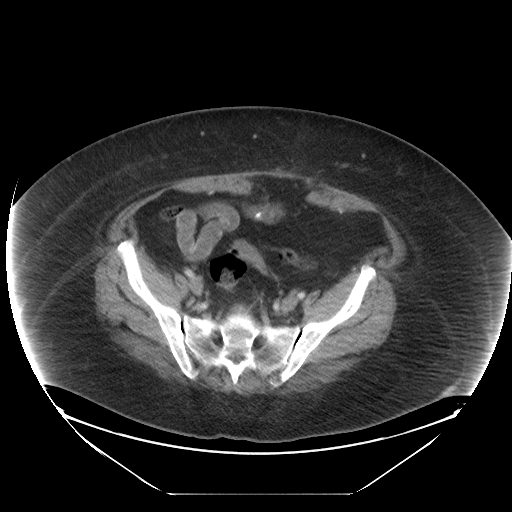
[im 44/99  soft-tissue]
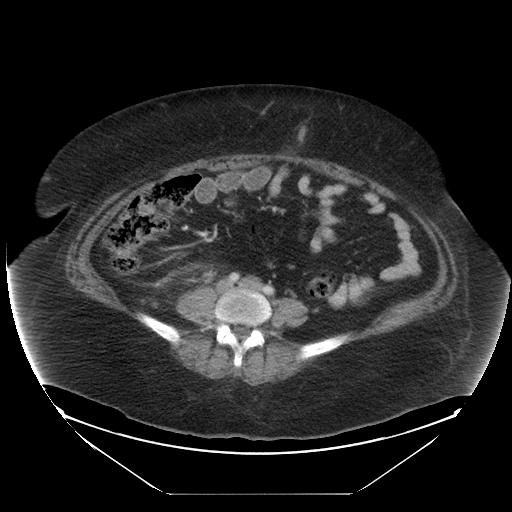
[im 55/99  soft-tissue]
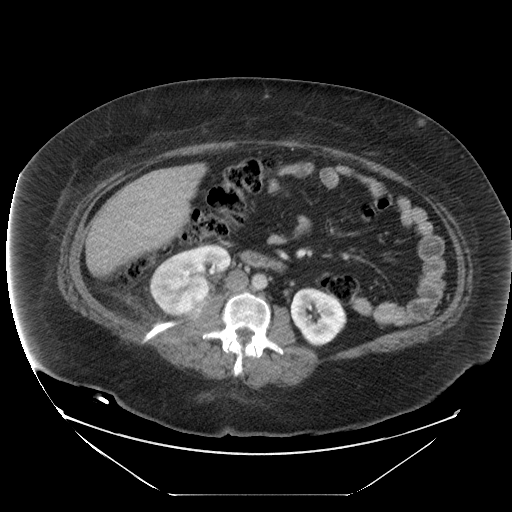
[im 55/99  lung]
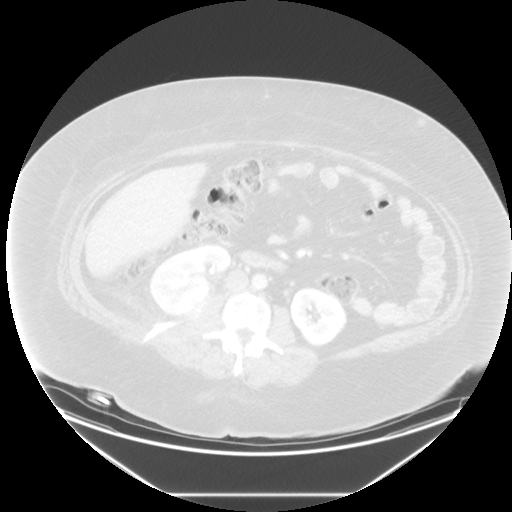
[im 66/99  soft-tissue]
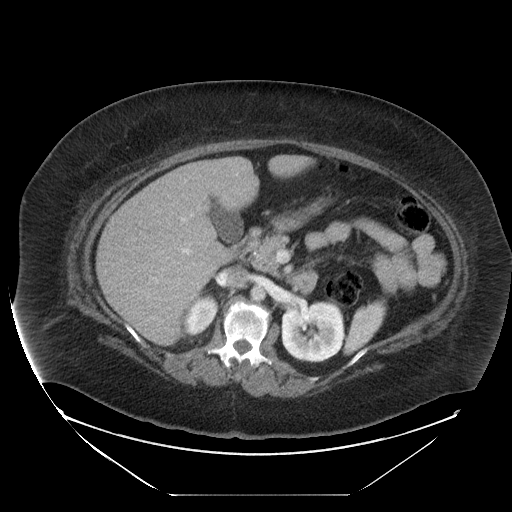
[im 66/99  lung]
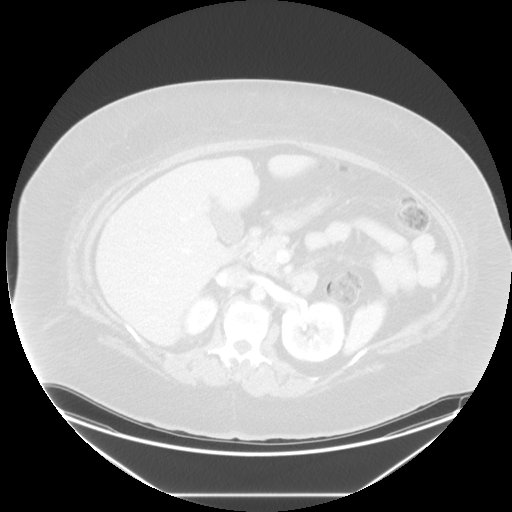
[im 77/99  soft-tissue]
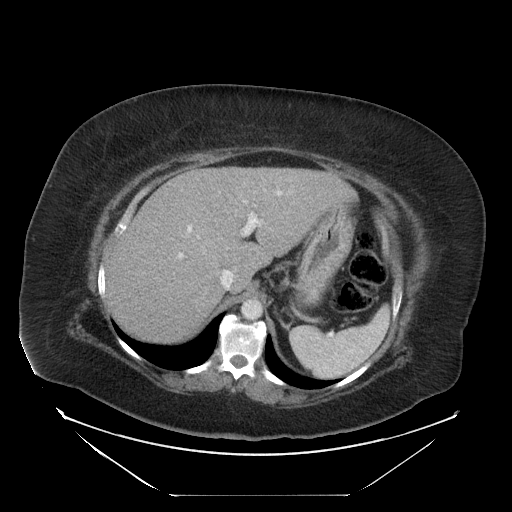
[im 77/99  lung]
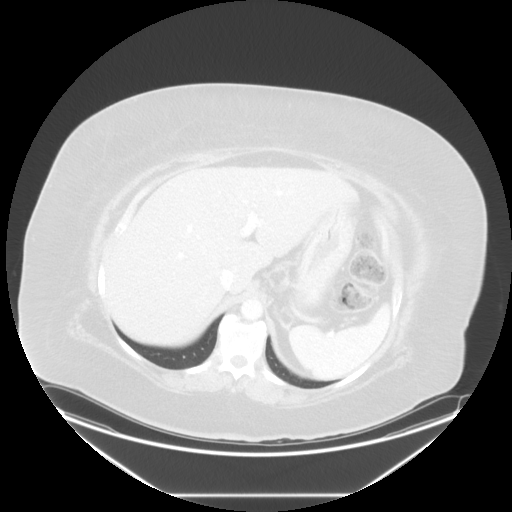
[im 88/99  soft-tissue]
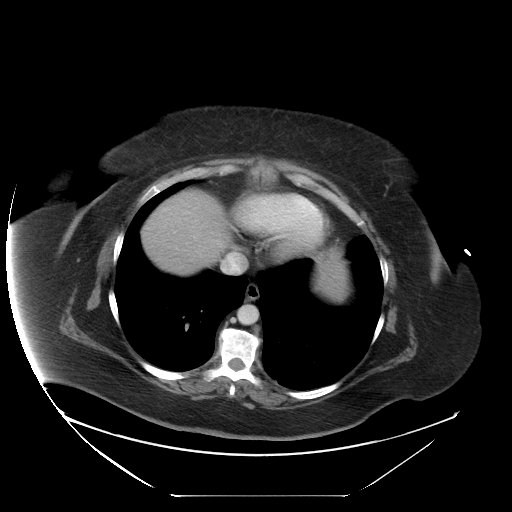
[im 88/99  lung]
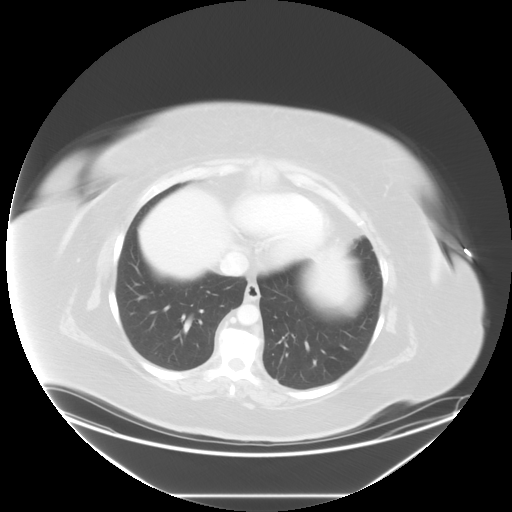

[Series 601: coronal body · coronal · 0.98mm/px · 1 of 145 slices shown, 2 images]
[im 49/145  soft-tissue]
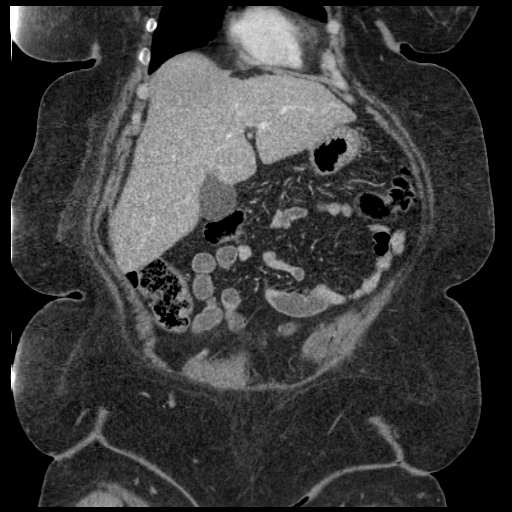
[im 49/145  bone]
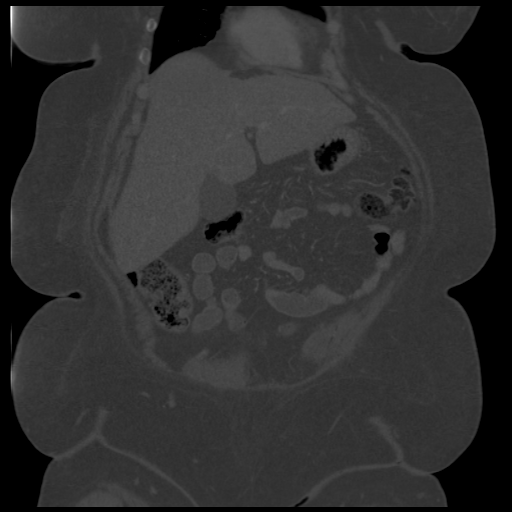

[Series 602: sagittal body · sagittal · 0.98mm/px · 4 of 199 slices shown]
[im 23/199  soft-tissue]
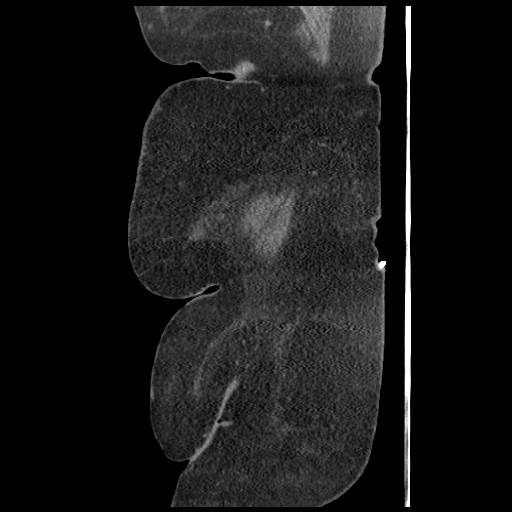
[im 45/199  soft-tissue]
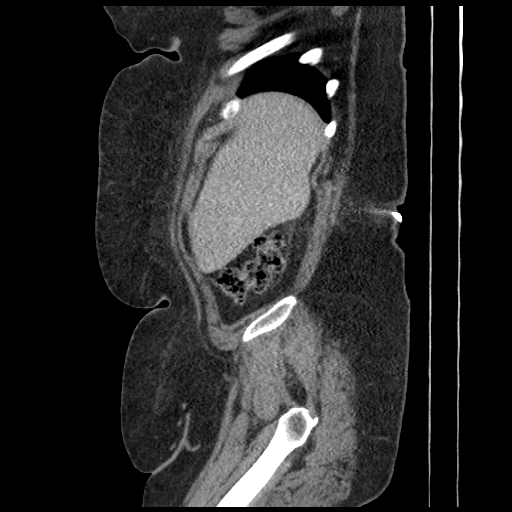
[im 67/199  soft-tissue]
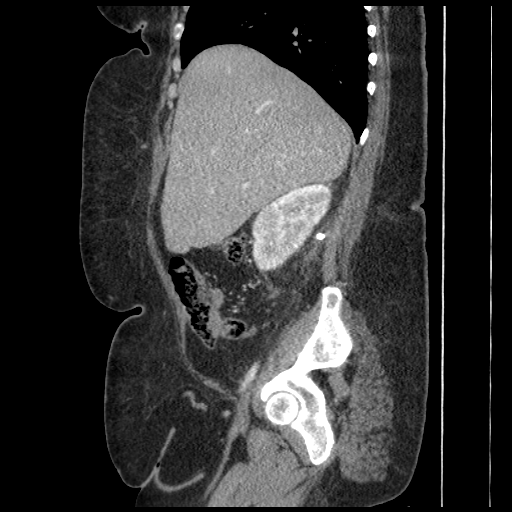
[im 89/199  soft-tissue]
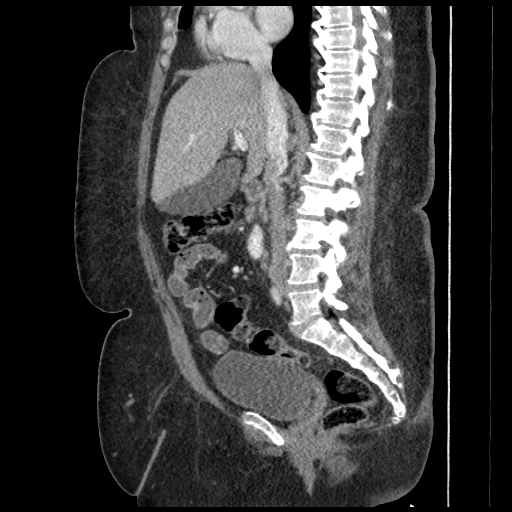

[13 of 36 positions shown; findings below may reference images not displayed]

FINDINGS: An abscess strain has been placed and is coiled within the location
of the abscess cavity between the right kidney and psoas muscle. The
abscess has completely resolved.

Stable right adrenal nodule.

Liver, spleen, pancreas, left adrenal gland, and left kidney are
within normal limits.

Nephro graphic phase of the right kidney is within normal limits.
There is some persistent perinephric stranding on the right
extending in the retroperitoneum.

Normal appendix.

Uterus is absent.  Bladder and adnexa are within normal limits.

No new abscess. No free-fluid. No abnormal retroperitoneal
adenopathy.
IMPRESSION: Right perinephric abscess has resolved.

## 2020-05-05 ENCOUNTER — Encounter: Payer: Self-pay | Admitting: Emergency Medicine

## 2020-05-05 ENCOUNTER — Other Ambulatory Visit: Payer: Self-pay

## 2020-05-05 ENCOUNTER — Emergency Department
Admission: EM | Admit: 2020-05-05 | Discharge: 2020-05-05 | Disposition: A | Discharge status: Home / Self Care | Payer: Self-pay | Source: Home / Self Care

## 2020-05-05 DIAGNOSIS — L03211 Cellulitis of face: Secondary | ICD-10-CM

## 2020-05-05 MED ORDER — HYDROCODONE-ACETAMINOPHEN 5-325 MG PO TABS: 1.0000 | ORAL_TABLET | Freq: Three times a day (TID) | ORAL | 0 refills | Status: DC | PRN

## 2020-05-05 MED ORDER — CLINDAMYCIN HCL 300 MG PO CAPS: 300.0000 mg | ORAL_CAPSULE | Freq: Three times a day (TID) | ORAL | 0 refills | Status: AC

## 2020-05-05 NOTE — Discharge Instructions (Signed)
  Please take antibiotics as prescribed and be sure to complete entire course even if you start to feel better to ensure infection does not come back.  Norco/Vicodin (hydrocodone-acetaminophen) is a narcotic pain medication, do not combine these medications with others containing tylenol. While taking, do not drink alcohol, drive, or perform any other activities that requires focus while taking these medications.   Keep area clean with warm water and antibacterial soap. Pat dry. Apply a warm damp washcloth to the area 3-4 times daily to help with pain and inflammation.  Follow up in 3-4 days if not improving, go to the hospital if symptoms worsening despite the antibiotic.

## 2020-05-05 NOTE — ED Provider Notes (Signed)
[facility]    CSN: [id number] Arrival date & time: 05/05/20  1557      History   Chief Complaint No chief complaint on file.   HPI Mandy Garza is a 49 y.o. female.   HPI Mandy Garza is a 49 y.o. female presenting to UC with c/o 1 week worsening Right side facial pain, swelling and redness. Denies fever, chills, n/v/d. She has taken ibuprofen with mild relief. No hx of skin infections for her but states her sister has had abscesses in the past. She is not sure if she needs her cheek drained this evening.    Past Medical History:  Diagnosis Date  . Headache    "weekly" (09/29/2015)  . High cholesterol   . Hypertension   . Obesity   . Pneumonia ~ 2008  . Type II diabetes mellitus [facility])     Patient Active Problem List   Diagnosis Date Noted  . Type 2 diabetes mellitus without complication, with long-term current use of insulin (HCC)   . Thrombocytopenia (HCC)   . AKI (acute kidney injury) (HCC) 09/30/2015  . Hyperglycemia 09/29/2015  . Pyelonephritis 09/29/2015  . Sepsis (HCC)   . Arterial hypotension   . Fever, unspecified   . Obesity 04/08/2015  . Elevated liver enzymes 04/08/2015  . Pure hypercholesterolemia 04/08/2015  . HLD (hyperlipidemia) 03/11/2015  . Perinephric abscess 02/14/2015  . Poorly controlled diabetes mellitus (HCC) 02/14/2015  . DKA (diabetic ketoacidoses) 02/14/2015  . Hyponatremia 02/14/2015  . Essential hypertension 02/14/2015    Past Surgical History:  Procedure Laterality Date  . ABDOMINAL HYSTERECTOMY  03/2002  . CESAREAN SECTION  1998; 2003    OB History   No obstetric history on file.      Home Medications    Prior to Admission medications   Medication Sig Start Date End Date Taking? Authorizing Provider  clindamycin (CLEOCIN) 300 MG capsule Take 1 capsule (300 mg total) by mouth 3 (three) times daily for 7 days. 05/05/20 05/12/20 Yes Lynette Noah, Vangie Bicker, PA-C  HYDROcodone-acetaminophen (NORCO/VICODIN)  5-325 MG tablet Take 1 tablet by mouth every 8 (eight) hours as needed for moderate pain or severe pain. 05/05/20  Yes Yosiel Thieme, Vangie Bicker, PA-C  Insulin Glargine (TOUJEO SOLOSTAR) 300 UNIT/ML SOPN Inject 50 Units into the skin at bedtime. 01/08/16   Shon Hale, MD  metFORMIN (GLUCOPHAGE) 500 MG tablet Take 500 mg by mouth 4 (four) times daily.    [provider]    Family History Family History  Problem Relation Age of Onset  . Diabetes Father   . Hypertension Father     Social History Social History   Tobacco Use  . Smoking status: Never Smoker  . Smokeless tobacco: Never Used  Vaping Use  . Vaping Use: Never used  Substance Use Topics  . Alcohol use: Yes  . Drug use: No     Allergies   Patient has no known allergies.   Review of Systems Review of Systems  Constitutional: Negative for chills and fever.  HENT: Positive for ear pain (right) and facial swelling. Negative for dental problem, ear discharge, hearing loss, sinus pressure and sinus pain.   Skin: Positive for color change and wound.  Neurological: Negative for dizziness and headaches.     Physical Exam Triage Vital Signs ED Triage Vitals  Enc Vitals Group     BP 05/05/20 1817 (!) 145/97     Pulse Rate 05/05/20 1817 69     Resp 05/05/20 1817 17  Temp 05/05/20 1817 99.4 F (37.4 C)     Temp Source 05/05/20 1817 Oral     SpO2 05/05/20 1817 94 %     Weight 05/05/20 1818 225 lb (102.1 kg)     Height 05/05/20 1818 4\' 8"  (1.422 m)     Head Circumference --      Peak Flow --      Pain Score 05/05/20 1818 9     Pain Loc --      Pain Edu? --      Excl. in GC? --    No data found.  Updated Vital Signs BP (!) 145/97 (BP Location: Right Arm)   Pulse 69   Temp 99.4 F (37.4 C) (Oral)   Resp 17   Ht 4\' 8"  (1.422 m)   Wt 225 lb (102.1 kg)   SpO2 94%   BMI 50.44 kg/m   Visual Acuity Right Eye Distance:   Left Eye Distance:   Bilateral Distance:    Right Eye Near:   Left Eye Near:     Bilateral Near:     Physical Exam Vitals and nursing note reviewed.  Constitutional:      Appearance: Normal appearance. She is well-developed and well-nourished.  HENT:     Head: Normocephalic and atraumatic.      Right Ear: Tympanic membrane and ear canal normal.     Left Ear: Tympanic membrane and ear canal normal.     Nose: Nose normal.     Mouth/Throat:     Lips: Pink.     Mouth: Mucous membranes are moist.     Dentition: Normal dentition. No dental tenderness, dental caries or dental abscesses.     Pharynx: Oropharynx is clear. Uvula midline. No pharyngeal swelling, oropharyngeal exudate, posterior oropharyngeal erythema or uvula swelling.  Eyes:     Extraocular Movements: EOM normal.  Cardiovascular:     Rate and Rhythm: Normal rate and regular rhythm.  Pulmonary:     Effort: Pulmonary effort is normal. No respiratory distress.     Breath sounds: Normal breath sounds.  Musculoskeletal:        General: Normal range of motion.     Cervical back: Normal range of motion and neck supple. No tenderness.  Lymphadenopathy:     Cervical: No cervical adenopathy.  Skin:    General: Skin is warm and dry.  Neurological:     Mental Status: She is alert and oriented to person, place, and time.  Psychiatric:        Mood and Affect: Mood and affect normal.        Behavior: Behavior normal.      UC Treatments / Results  Labs (all labs ordered are listed, but only abnormal results are displayed) Labs Reviewed - No data to display  EKG   Radiology No results found.  Procedures Procedures (including critical care time)  Medications Ordered in UC Medications - No data to display  Initial Impression / Assessment and Plan / UC Course  I have reviewed the triage vital signs and the nursing notes.  Pertinent labs & imaging results that were available during my care of the patient were reviewed by me and considered in my medical decision making (see chart for details).      Cellulitis without abscess on Right cheek Will start on clindamycin  Small amount of norco for pain in addition to ibuprofen F/u 3-4 days Discussed symptoms that warrant emergent care in the ED. AVS given  Final Clinical  Impressions(s) / UC Diagnoses   Final diagnoses:  Cellulitis of right external cheek     Discharge Instructions      Please take antibiotics as prescribed and be sure to complete entire course even if you start to feel better to ensure infection does not come back.  Norco/Vicodin (hydrocodone-acetaminophen) is a narcotic pain medication, do not combine these medications with others containing tylenol. While taking, do not drink alcohol, drive, or perform any other activities that requires focus while taking these medications.   Keep area clean with warm water and antibacterial soap. Pat dry. Apply a warm damp washcloth to the area 3-4 times daily to help with pain and inflammation.  Follow up in 3-4 days if not improving, go to the hospital if symptoms worsening despite the antibiotic.     ED Prescriptions    Medication Sig Dispense Auth. Provider   clindamycin (CLEOCIN) 300 MG capsule Take 1 capsule (300 mg total) by mouth 3 (three) times daily for 7 days. 21 capsule Lurene Shadow, [place]   HYDROcodone-acetaminophen (NORCO/VICODIN) 5-325 MG tablet Take 1 tablet by mouth every 8 (eight) hours as needed for moderate pain or severe pain. 6 tablet Lurene Shadow, [place]     I have reviewed the PDMP during this encounter.   Lurene Shadow, [place] 05/05/20 2038

## 2020-05-05 NOTE — ED Triage Notes (Signed)
Cellulitis of rt face x 1 week

## 2020-05-19 ENCOUNTER — Other Ambulatory Visit: Payer: Self-pay

## 2021-01-30 ENCOUNTER — Encounter: Payer: Self-pay | Admitting: Emergency Medicine

## 2021-02-01 ENCOUNTER — Encounter: Payer: Self-pay | Admitting: Pulmonary Disease

## 2021-02-01 ENCOUNTER — Other Ambulatory Visit: Payer: Self-pay

## 2021-02-01 ENCOUNTER — Ambulatory Visit (INDEPENDENT_AMBULATORY_CARE_PROVIDER_SITE_OTHER): Payer: Self-pay | Admitting: Pulmonary Disease

## 2021-02-01 ENCOUNTER — Telehealth: Payer: Self-pay

## 2021-02-01 VITALS — BP 102/60 | HR 78 | Temp 98.0°F | Ht <= 58 in | Wt 239.0 lb

## 2021-02-01 DIAGNOSIS — R0602 Shortness of breath: Secondary | ICD-10-CM

## 2021-02-01 DIAGNOSIS — R0683 Snoring: Secondary | ICD-10-CM

## 2021-02-01 NOTE — Telephone Encounter (Signed)
-----   Message from Martina Sinner, MD sent at 02/01/2021  4:30 PM EDT ----- Regarding: CTA Chest from [facility],  Can you please request a digital transfer to our PACs system for the CTA Chest done on 12/16/20 at [facility].   Thanks, Cletis Athens

## 2021-02-01 NOTE — Progress Notes (Signed)
Synopsis: Referred in September 2022 for shortness of breath by Lowella Bandy  Subjective:   PATIENT ID: Mandy Garza GENDER: female DOB: July 29, 1970, MRN: [id number]   HPI  Chief Complaint  Patient presents with   Follow-up    Mandy Garza is a 50 year old woman, never smoker with obesity, hypertension and type II diabetes who is referred to pulmonary clinic for shortness of breath.   She reports starting supplemental oxygen at the beginning of the year. She is using oxygen at night and with exertion. She is limited with her physical activity due to her dyspnea. She denies cough, sputum production or wheezing. She denies orthopnea. She does report bilateral lower extremity edema over the past 2-3 months. She does report some nighttime awakenings with shortness of breath. She complains of daytime sleepiness and does not feel rested after sleeping. She does snore occasionally. She reports her weight has been stable over the past 10 years. She complains of posterior lung pain that is sporadic and is not able to specify whether there is an aggravating etiology.  She denies history of smoking or second hand smoke. She is originally from [place] and has lived in the [place] since she was 50 years old. No exposure to charcoal stoves when growing up.   She was recently admitted to [facility] 12/15/20 for septic shock due to left pyelonephritis and E. Coli bacteremia. She was also treated for covid with remdesivir. CTA Chest from her admission did not reveal pulmonary emboli but did show bilateral pulmonary infiltrates or pulmonary edema and cardiomegaly.   Past Medical History:  Diagnosis Date   Headache    "weekly" (09/29/2015)   High cholesterol    Hypertension    Obesity    Pneumonia ~ 2008   Type II diabetes mellitus (HCC)      Family History  Problem Relation Age of Onset   Diabetes Father    Hypertension Father      Social History   Socioeconomic History   Marital status:  Not on file    Spouse name: Not on file   Number of children: Not on file   Years of education: Not on file   Highest education level: Not on file  Occupational History   Not on file  Tobacco Use   Smoking status: Never   Smokeless tobacco: Never  Vaping Use   Vaping Use: Never used  Substance and Sexual Activity   Alcohol use: Yes   Drug use: No   Sexual activity: Yes    Birth control/protection: None  Other Topics Concern   Not on file  Social History Narrative   ** Merged History Encounter **       Social Determinants of Health   Financial Resource Strain: Not on file  Food Insecurity: Not on file  Transportation Needs: Not on file  Physical Activity: Not on file  Stress: Not on file  Social Connections: Not on file  Intimate Partner Violence: Not on file     No Known Allergies   Outpatient Medications Prior to Visit  Medication Sig Dispense Refill   HYDROcodone-acetaminophen (NORCO/VICODIN) 5-325 MG tablet Take 1 tablet by mouth every 8 (eight) hours as needed for moderate pain or severe pain. 6 tablet 0   Insulin Glargine (TOUJEO SOLOSTAR) 300 UNIT/ML SOPN Inject 50 Units into the skin at bedtime. 5 pen 3   metFORMIN (GLUCOPHAGE) 500 MG tablet Take 500 mg by mouth 4 (four) times daily.     No facility-administered  medications prior to visit.    Review of Systems  Constitutional:  Negative for chills, fever, malaise/fatigue and weight loss.  HENT:  Negative for congestion, sinus pain and sore throat.   Eyes: Negative.   Respiratory:  Positive for shortness of breath. Negative for cough, hemoptysis, sputum production and wheezing.   Cardiovascular:  Positive for leg swelling. Negative for chest pain, palpitations, orthopnea and claudication.  Gastrointestinal:  Negative for abdominal pain, heartburn, nausea and vomiting.  Genitourinary: Negative.   Musculoskeletal:  Negative for joint pain and myalgias.  Skin:  Negative for rash.  Neurological:  Negative for  weakness.  Endo/Heme/Allergies: Negative.   Psychiatric/Behavioral: Negative.     Objective:   Vitals:   02/01/21 1517  BP: 102/60  Pulse: 78  Temp: 98 F (36.7 C)  TempSrc: Oral  SpO2: 94%  Weight: 239 lb (108.4 kg)  Height: 4\' 8"  (1.422 m)     Physical Exam Constitutional:      General: She is not in acute distress.    Appearance: She is obese. She is not ill-appearing.  HENT:     Head: Normocephalic and atraumatic.  Eyes:     General: No scleral icterus.    Conjunctiva/sclera: Conjunctivae normal.     Pupils: Pupils are equal, round, and reactive to light.  Cardiovascular:     Rate and Rhythm: Normal rate and regular rhythm.     Pulses: Normal pulses.     Heart sounds: Normal heart sounds. No murmur heard. Pulmonary:     Effort: Pulmonary effort is normal.     Breath sounds: Normal breath sounds. Decreased air movement present. No wheezing, rhonchi or rales.  Abdominal:     General: Bowel sounds are normal.     Palpations: Abdomen is soft.  Musculoskeletal:     Right lower leg: Edema (2+) present.     Left lower leg: Edema (2+) present.  Lymphadenopathy:     Cervical: No cervical adenopathy.  Skin:    General: Skin is warm and dry.  Neurological:     General: No focal deficit present.     Mental Status: She is alert.  Psychiatric:        Mood and Affect: Mood normal.        Behavior: Behavior normal.        Thought Content: Thought content normal.        Judgment: Judgment normal.    CBC    Component Value Date/Time   WBC 8.9 01/06/2016 0454   RBC 4.37 01/06/2016 0454   HGB 12.8 01/06/2016 0454   HCT 38.8 01/06/2016 0454   PLT 160 01/06/2016 0454   MCV 88.8 01/06/2016 0454   MCH 29.3 01/06/2016 0454   MCHC 33.0 01/06/2016 0454   RDW 13.1 01/06/2016 0454   LYMPHSABS 2.1 09/29/2015 1051   MONOABS 0.8 09/29/2015 1051   EOSABS 0.0 09/29/2015 1051   BASOSABS 0.0 09/29/2015 1051   BMP Latest Ref Rng & Units 01/06/2016 01/05/2016 10/04/2015  Glucose  65 - 99 mg/dL 10/06/2015) 809(X) 833(A)  BUN 6 - 20 mg/dL 12 8 250(N)  Creatinine 0.44 - 1.00 mg/dL <3(Z 7.67 3.41  Sodium 135 - 145 mmol/L 134(L) 129(L) 137  Potassium 3.5 - 5.1 mmol/L 3.9 4.2 3.5  Chloride 101 - 111 mmol/L 100(L) 97(L) 104  CO2 22 - 32 mmol/L 23 23 23   Calcium 8.9 - 10.3 mg/dL 8.0(L) 8.9 7.8(L)   Chest imaging: CTA Chest 12/16/20 No pulmonary embolus. No adenopathy, no pleural or  pericardial effusion. Cardiomegaly present. Bilateral infiltrate or edema.   CXR 09/29/2015 Cardiac shadow is mildly enlarged in size. The lungs are well aerated with minimal right basilar atelectasis. No focal confluent infiltrate is seen. No bony abnormality is noted.  PFT: No flowsheet data found.  Labs: Serum bicarb 07/12/2019: 29, 12/21/2020 27 [place] 12/18/20 129  Path:  Echo:  Heart Catheterization:  Assessment & Plan:   Shortness of breath - Plan: ECHOCARDIOGRAM COMPLETE  Class 3 severe obesity due to excess calories with serious comorbidity in adult, unspecified BMI (HCC) - Plan: Split night study  Snoring - Plan: Split night study  Discussion: Mandy Garza is a 50 year old woman, never smoker with obesity, hypertension and type II diabetes who is referred to pulmonary clinic for shortness of breath.   Her dyspnea is related to her obesity with concern for obesity hypoventilation syndrome and sleep disordered breathing that could be leading to pulmonary hypertension with heart failure.   She has SpO2 desaturations below 88% on simple walk in clinic today. She can continue 2L of O2 with ambulation.   I spoke with her daughters and they expressed concern for out of pocket pay as the patient is uninsured. Given this information, we discussed that an in-lab sleep study and cardiac ECHO would give [place] the most information at this time.   We will request her CTA Chest from [facility] be transferred to our [facility] system for personal review. I believe the infiltrates noted on the scan  are secondary to covid, pulmonary edema or atelectasis. She has no concerning history of inflammatory lung disease.  We will schedule follow up according to her echo and sleep study results.  Melody Comas, MD [facility] Office: [phone]   Current Outpatient Medications:    HYDROcodone-acetaminophen (NORCO/VICODIN) 5-325 MG tablet, Take 1 tablet by mouth every 8 (eight) hours as needed for moderate pain or severe pain., Disp: 6 tablet, Rfl: 0   Insulin Glargine (TOUJEO SOLOSTAR) 300 UNIT/ML SOPN, Inject 50 Units into the skin at bedtime., Disp: 5 pen, Rfl: 3   metFORMIN (GLUCOPHAGE) 500 MG tablet, Take 500 mg by mouth 4 (four) times daily., Disp: , Rfl:

## 2021-02-01 NOTE — Telephone Encounter (Signed)
Request has been faxed to [facility] for processing.

## 2021-02-01 NOTE — Patient Instructions (Addendum)
I am concerned that you have obesity hypoventilation syndrome and sleep disordered breathing that could be leading to possible pulmonary hypertension or heart failure.   We will order an in-lab sleep study at [facility].   We will order a cardiac ECHO to evaluate your heart function.   We will consider further chest imaging in the future if needed.   Work on [employer] loss with increasing your daily walking time.

## 2021-02-09 ENCOUNTER — Other Ambulatory Visit: Payer: Self-pay

## 2021-02-09 ENCOUNTER — Ambulatory Visit (HOSPITAL_COMMUNITY)
Admission: RE | Admit: 2021-02-09 | Discharge: 2021-02-09 | Disposition: A | Discharge status: Home / Self Care | Payer: Self-pay | Source: Ambulatory Visit | Attending: Pulmonary Disease | Admitting: Pulmonary Disease

## 2021-02-09 DIAGNOSIS — E119 Type 2 diabetes mellitus without complications: Secondary | ICD-10-CM | POA: Insufficient documentation

## 2021-02-09 DIAGNOSIS — I119 Hypertensive heart disease without heart failure: Secondary | ICD-10-CM | POA: Insufficient documentation

## 2021-02-09 DIAGNOSIS — R0602 Shortness of breath: Secondary | ICD-10-CM | POA: Insufficient documentation

## 2021-02-09 NOTE — Progress Notes (Signed)
  Echocardiogram 2D Echocardiogram has been performed.  Janalyn Harder 02/09/2021, 10:13 AM

## 2021-02-10 LAB — ECHOCARDIOGRAM COMPLETE
Area-P 1/2: 3.48 cm2
Calc EF: 64.2 %
S' Lateral: 3 cm
Single Plane A2C EF: 69.7 %
Single Plane A4C EF: 58.9 %

## 2021-02-14 ENCOUNTER — Ambulatory Visit: Payer: Self-pay | Attending: Pulmonary Disease | Admitting: Pulmonary Disease

## 2021-02-14 ENCOUNTER — Other Ambulatory Visit: Payer: Self-pay

## 2021-02-14 DIAGNOSIS — R0683 Snoring: Secondary | ICD-10-CM

## 2021-02-14 DIAGNOSIS — G4733 Obstructive sleep apnea (adult) (pediatric): Secondary | ICD-10-CM | POA: Insufficient documentation

## 2021-02-14 DIAGNOSIS — Z6841 Body Mass Index (BMI) 40.0 and over, adult: Secondary | ICD-10-CM | POA: Insufficient documentation

## 2021-02-21 NOTE — Procedures (Signed)
Patient Name: Mandy, Garza Date: 02/14/2021 Gender: Female D.O.B: 1970/12/02 Age (years): 50 Referring Provider: Freda Jackson Height (inches): 6 Interpreting Physician: Chesley Mires MD, ABSM Weight (lbs): 225 RPSGT: Rosebud Poles BMI: 69 MRN: [id number] Neck Size: 17.00  CLINICAL INFORMATION Sleep Study Type: Split Night CPAP  Indication for sleep study: snoring, sleep disruption, and chronic hypoxic respiratory failure.  Epworth Sleepiness Score: 6  SLEEP STUDY TECHNIQUE As per the AASM Manual for the Scoring of Sleep and Associated Events v2.3 (April 2016) with a hypopnea requiring 4% desaturations.  The channels recorded and monitored were frontal, central and occipital EEG, electrooculogram (EOG), submentalis EMG (chin), nasal and oral airflow, thoracic and abdominal wall motion, anterior tibialis EMG, snore microphone, electrocardiogram, and pulse oximetry. Continuous positive airway pressure (CPAP) was initiated when the patient met split night criteria and was titrated according to treat sleep-disordered breathing.  MEDICATIONS Medications self-administered by patient taken the night of the study : N/A  RESPIRATORY PARAMETERS Diagnostic  Total AHI (/hr): 64.1 RDI (/hr): 64.1 OA Index (/hr): 7.9 CA Index (/hr): 0.0 REM AHI (/hr): 60.0 NREM AHI (/hr): 64.8 Supine AHI (/hr): N/A Non-supine AHI (/hr): 64.1 Min O2 Sat (%): 51.00 Mean O2 (%): 86.23 Time below 88% (min): 77.8   Titration  Optimal Pressure (cm): 11 AHI at Optimal Pressure (/hr): 1.1 Min O2 at Optimal Pressure (%): 89.0 Supine % at Optimal (%): 41 Sleep % at Optimal (%): 96    She continued to have oxygen desaturation < 88% while using CPAP in the abscence of other respiratory events, and these last longer than 5 minutes.  This improved after 1 liter supplemental oxygen was added with CPAP.  SLEEP ARCHITECTURE The recording time for the entire night was 390.9 minutes.  During a  baseline period of 160.3 minutes, the patient slept for 145.0 minutes in REM and nonREM, yielding a sleep efficiency of 90.4%. Sleep onset after lights out was 15.2 minutes with a REM latency of 87.5 minutes. The patient spent 1.72% of the night in stage N1 sleep, 65.86% in stage N2 sleep, 18.62% in stage N3 and 13.8% in REM.  During the titration period of 226.0 minutes, the patient slept for 223.7 minutes in REM and nonREM, yielding a sleep efficiency of 99.0%. Sleep onset after CPAP initiation was 0.3 minutes with a REM latency of 34.0 minutes. The patient spent 0.89% of the night in stage N1 sleep, 53.29% in stage N2 sleep, 28.61% in stage N3 and 17.2% in REM.  CARDIAC DATA The 2 lead EKG demonstrated sinus rhythm. The mean heart rate was 74.79 beats per minute. Other EKG findings include: PVCs.  LEG MOVEMENT DATA The total Periodic Limb Movements of Sleep (PLMS) were 229. The PLMS index was 37.27 .  IMPRESSIONS - Severe obstructive sleep apnea with an AHI of 61.4 and SpO2 low of 51%. - She did well with CPAP 11 cm H2O and 1 liter supplemental oxygen.  DIAGNOSIS - Obstructive Sleep Apnea (G47.33)  RECOMMENDATIONS - Trial of CPAP therapy on 11 cm H2O with 1 liter supplemental oxygen. - She was fitted with a Small size Resmed Full Face Mask AirFit F20 mask and heated humidification. - Avoid alcohol, sedatives and other CNS depressants that may worsen sleep apnea and disrupt normal sleep architecture. - Sleep hygiene should be reviewed to assess factors that may improve sleep quality. - Weight management and regular exercise should be initiated or continued.  [Electronically signed] 02/21/2021 02:16 PM  Chesley Mires MD, ABSM Diplomate, American  Board of Sleep Medicine NPI: [id number]  [place] PH: [phone]   FX: [phone] [place]

## 2021-03-02 NOTE — Telephone Encounter (Signed)
Hello again,   I wanted to check in to see if there has been any update for my sleep study and echo results since it has been a few weeks since completed.    Thank you!!   Dr. Francine Graven please advise.

## 2021-03-03 NOTE — Telephone Encounter (Signed)
Called and spoke to patient to relay sleep study results. She was agreeable to the CPAP. Went over recommendations per Dr. Francine Graven. Patient agreed to see Dr. Craige Cotta for sleep and once she gets cpap set up she will call for an appt. Will contact cardiology dept. Regarding echo report.   Contacted [facility] Cardiac ultrasounds dept. With number [phone]. Spoke to receptionist who is going to send a request for the Echo to be read.

## 2021-03-05 ENCOUNTER — Other Ambulatory Visit: Payer: Self-pay

## 2021-03-06 ENCOUNTER — Other Ambulatory Visit: Payer: Self-pay

## 2021-03-06 ENCOUNTER — Encounter: Payer: Self-pay | Admitting: Emergency Medicine

## 2021-03-06 ENCOUNTER — Emergency Department (INDEPENDENT_AMBULATORY_CARE_PROVIDER_SITE_OTHER)
Admission: EM | Admit: 2021-03-06 | Discharge: 2021-03-06 | Disposition: A | Discharge status: Home / Self Care | Payer: Self-pay | Source: Home / Self Care | Attending: Family Medicine | Admitting: Family Medicine

## 2021-03-06 DIAGNOSIS — R300 Dysuria: Secondary | ICD-10-CM

## 2021-03-06 DIAGNOSIS — N3090 Cystitis, unspecified without hematuria: Secondary | ICD-10-CM

## 2021-03-06 LAB — POCT URINALYSIS DIP (MANUAL ENTRY)
Glucose, UA: 500 mg/dL — AB
Nitrite, UA: POSITIVE — AB
Protein Ur, POC: 30 mg/dL — AB
Spec Grav, UA: 1.025 (ref 1.010–1.025)
Urobilinogen, UA: 1 E.U./dL
pH, UA: 6 (ref 5.0–8.0)

## 2021-03-06 MED ORDER — NITROFURANTOIN MONOHYD MACRO 100 MG PO CAPS: ORAL_CAPSULE | ORAL | 0 refills | Status: DC

## 2021-03-06 NOTE — ED Triage Notes (Signed)
Dysuria x 4 days, Low back pain Had Covid in January Unvaccinated

## 2021-03-06 NOTE — Discharge Instructions (Signed)
Increase fluid intake. May use non-prescription AZO for about two days, if desired, to decrease urinary discomfort.  If symptoms become significantly worse during the night or over the weekend, proceed to the local emergency room.  

## 2021-03-06 NOTE — ED Provider Notes (Signed)
[facility]    CSN: [id number] Arrival date & time: 03/06/21  1028      History   Chief Complaint Chief Complaint  Patient presents with   Dysuria    HPI Mandy Garza is a 50 y.o. female.   Patient complains of dysuria, urgency, and nocturia for 3 days.  She noticed a small amount of blood in her urine today.  She denies fever but has felt hot during the past several days.  She has also had vague lower back ache but denies abdominal/pelvic pain.  The history is provided by the patient.  Dysuria Pain quality:  Burning Pain severity:  Mild Onset quality:  Sudden Duration:  3 days Timing:  Constant Progression:  Worsening Chronicity:  New Recent urinary tract infections: no   Relieved by:  None tried Worsened by:  Nothing Ineffective treatments:  None tried Urinary symptoms: frequent urination, hematuria, hesitancy and incontinence   Urinary symptoms: no discolored urine and no foul-smelling urine   Associated symptoms: no abdominal pain, no fever, no flank pain, no genital lesions and no nausea    Past Medical History:  Diagnosis Date   Headache    "weekly" (09/29/2015)   High cholesterol    Hypertension    Obesity    Pneumonia ~ 2008   Type II diabetes mellitus ([place])     Patient Active Problem List   Diagnosis Date Noted   Type 2 diabetes mellitus without complication, with long-term current use of insulin ([place])    Thrombocytopenia ([place])    AKI (acute kidney injury) ([place]) 09/30/2015   Hyperglycemia 09/29/2015   Pyelonephritis 09/29/2015   Sepsis ([place])    Arterial hypotension    Fever, unspecified    Obesity 04/08/2015   Elevated liver enzymes 04/08/2015   Pure hypercholesterolemia 04/08/2015   HLD (hyperlipidemia) 03/11/2015   Perinephric abscess 02/14/2015   Poorly controlled diabetes mellitus ([place]) 02/14/2015   DKA (diabetic ketoacidoses) 02/14/2015   Hyponatremia 02/14/2015   Essential hypertension 02/14/2015    Past Surgical  History:  Procedure Laterality Date   ABDOMINAL HYSTERECTOMY  03/2002   [place]; 2003    OB History   No obstetric history on file.      Home Medications    Prior to Admission medications   Medication Sig Start Date End Date Taking? Authorizing Provider  glipiZIDE (GLUCOTROL) 10 MG tablet Take 10 mg by mouth daily before breakfast.   Yes [provider]  lisinopril (ZESTRIL) 10 MG tablet Take 10 mg by mouth daily.   Yes [provider]  nitrofurantoin, macrocrystal-monohydrate, (MACROBID) 100 MG capsule Take one cap PO Q12hr with food. 03/06/21  Yes Kandra Nicolas, MD  Insulin Glargine (TOUJEO SOLOSTAR) 300 UNIT/ML SOPN Inject 50 Units into the skin at bedtime. 01/08/16   Glenis Smoker, MD  metFORMIN (GLUCOPHAGE) 500 MG tablet Take 500 mg by mouth 4 (four) times daily.    [provider]    Family History Family History  Problem Relation Age of Onset   Diabetes Father    Hypertension Father     Social History Social History   Tobacco Use   Smoking status: Never   Smokeless tobacco: Never  Vaping Use   Vaping Use: Never used  Substance Use Topics   Alcohol use: Yes   Drug use: No     Allergies   Patient has no known allergies.   Review of Systems Review of Systems  Constitutional:  Negative for appetite  change, chills, diaphoresis, fatigue and fever.  Gastrointestinal:  Negative for abdominal pain and nausea.  Genitourinary:  Positive for dysuria, frequency, hematuria and urgency. Negative for flank pain and pelvic pain.  All other systems reviewed and are negative.   Physical Exam Triage Vital Signs ED Triage Vitals  Enc Vitals Group     BP 03/06/21 1230 (!) 86/56     Pulse Rate 03/06/21 1230 80     Resp 03/06/21 1230 18     Temp 03/06/21 1230 98.9 F (37.2 C)     Temp Source 03/06/21 1230 Oral     SpO2 03/06/21 1230 96 %     Weight 03/06/21 1231 225 lb (102.1 kg)     Height 03/06/21 1231 4\' 8"   (1.422 m)     Head Circumference --      Peak Flow --      Pain Score 03/06/21 1230 8     Pain Loc --      Pain Edu? --      Excl. in GC? --    No data found.  Updated Vital Signs BP (!) 86/56 (BP Location: Left Arm)   Pulse 80   Temp 98.9 F (37.2 C) (Oral)   Resp 18   Ht 4\' 8"  (1.422 m)   Wt 102.1 kg   SpO2 96%   BMI 50.44 kg/m   Visual Acuity Right Eye Distance:   Left Eye Distance:   Bilateral Distance:    Right Eye Near:   Left Eye Near:    Bilateral Near:     Physical Exam Nursing notes and Vital Signs reviewed. Appearance:  Patient appears stated age, and in no acute distress.    Eyes:  Pupils are equal, round, and reactive to light and accomodation.  Extraocular movement is intact.  Conjunctivae are not inflamed   Pharynx:  Normal; moist mucous membranes  Neck:  Supple.  No adenopathy Lungs:  Clear to auscultation.  Breath sounds are equal.  Moving air well. Heart:  Regular rate and rhythm without murmurs, rubs, or gallops.  Abdomen:  Nontender without masses or hepatosplenomegaly.  Bowel sounds are present.  No CVA or flank tenderness.  Extremities:  No edema.  Skin:  No rash present.     UC Treatments / Results  Labs (all labs ordered are listed, but only abnormal results are displayed) Labs Reviewed  POCT URINALYSIS DIP (MANUAL ENTRY) - Abnormal; Notable for the following components:      Result Value   Color, UA other (*)    Clarity, UA cloudy (*)    Glucose, UA =500 (*)    Bilirubin, UA small (*)    Ketones, POC UA moderate (40) (*)    Blood, UA moderate (*)    Protein Ur, POC =30 (*)    Nitrite, UA Positive (*)    Leukocytes, UA Small (1+) (*)    All other components within normal limits  URINE CULTURE    EKG   Radiology No results found.  Procedures Procedures (including critical care time)  Medications Ordered in UC Medications - No data to display  Initial Impression / Assessment and Plan / UC Course  I have reviewed the  triage vital signs and the nursing notes.  Pertinent labs & imaging results that were available during my care of the patient were reviewed by me and considered in my medical decision making (see chart for details).    Begin Macrobid.  Urine culture pending. Followup with Family Doctor  if not improved in one week.   Final Clinical Impressions(s) / UC Diagnoses   Final diagnoses:  Dysuria  Cystitis     Discharge Instructions      Increase fluid intake. May use non-prescription AZO for about two days, if desired, to decrease urinary discomfort.   If symptoms become significantly worse during the night or over the weekend, proceed to the local emergency room.      ED Prescriptions     Medication Sig Dispense Auth. Provider   nitrofurantoin, macrocrystal-monohydrate, (MACROBID) 100 MG capsule Take one cap PO Q12hr with food. 14 capsule Kandra Nicolas, MD         Kandra Nicolas, MD 03/07/21 [phone]

## 2021-03-07 ENCOUNTER — Other Ambulatory Visit: Payer: Self-pay

## 2021-03-07 DIAGNOSIS — G4733 Obstructive sleep apnea (adult) (pediatric): Secondary | ICD-10-CM

## 2021-03-07 DIAGNOSIS — I5030 Unspecified diastolic (congestive) heart failure: Secondary | ICD-10-CM

## 2021-03-07 DIAGNOSIS — R0683 Snoring: Secondary | ICD-10-CM

## 2021-03-08 LAB — URINE CULTURE
MICRO NUMBER:: 12574664
SPECIMEN QUALITY:: ADEQUATE

## 2021-03-08 NOTE — Telephone Encounter (Signed)
Received papers from Dr. Cindi Carbon box and new order has been placed with the settings of 11 cm H2O and added Luna to the order.  Nothing further needed at this time.

## 2021-03-08 NOTE — Telephone Encounter (Signed)
Mandy Garza from [place] came into the office states the order placed needs to be change to luna instead of just Resmed. No therapy instructions were on the order which needs to be added (pressure: 11cm H2O).  Order that [facility] dropped off placed in Dr. Cindi Carbon box.  Please advise.

## 2021-03-10 ENCOUNTER — Encounter: Payer: Self-pay | Admitting: Cardiovascular Disease

## 2021-03-10 ENCOUNTER — Ambulatory Visit (INDEPENDENT_AMBULATORY_CARE_PROVIDER_SITE_OTHER): Payer: Self-pay | Admitting: Cardiovascular Disease

## 2021-03-10 ENCOUNTER — Other Ambulatory Visit: Payer: Self-pay

## 2021-03-10 VITALS — BP 116/62 | HR 73 | Ht <= 58 in | Wt 234.8 lb

## 2021-03-10 DIAGNOSIS — E785 Hyperlipidemia, unspecified: Secondary | ICD-10-CM

## 2021-03-10 DIAGNOSIS — I5032 Chronic diastolic (congestive) heart failure: Secondary | ICD-10-CM

## 2021-03-10 DIAGNOSIS — E669 Obesity, unspecified: Secondary | ICD-10-CM

## 2021-03-10 DIAGNOSIS — E1165 Type 2 diabetes mellitus with hyperglycemia: Secondary | ICD-10-CM

## 2021-03-10 DIAGNOSIS — R0602 Shortness of breath: Secondary | ICD-10-CM

## 2021-03-10 DIAGNOSIS — I10 Essential (primary) hypertension: Secondary | ICD-10-CM

## 2021-03-10 MED ORDER — FUROSEMIDE 40 MG PO TABS: 40.0000 mg | ORAL_TABLET | Freq: Every day | ORAL | 11 refills | Status: DC

## 2021-03-10 MED ORDER — DAPAGLIFLOZIN PROPANEDIOL 10 MG PO TABS: 10.0000 mg | ORAL_TABLET | Freq: Every day | ORAL | 11 refills | Status: AC

## 2021-03-10 NOTE — Progress Notes (Signed)
Cardiology Office Note:    Date:  03/12/2021   ID:  [name], DOB 11-02-1970, MRN [id number]  PCP:  Cathleen Corti, PA-C   [facility] Providers Cardiologist:  Sanda Klein, MD     Referring MD: Freddi Starr, MD   Chief Complaint  Patient presents with   Congestive Heart Failure       Mandy Garza is a 50 y.o. female who is being seen today for the evaluation of CHF at the request of Freddi Starr, MD.   History of Present Illness:    Mandy Garza is a 50 y.o. female with super morbid obesity, hypertension, type 2 diabetes mellitus, and a hx of congestive heart failure with preserved left ventricular systolic function that became apparent during the hospitalization for acute respiratory failure during COVID-19 infection in January 2022, when she was [facility].    She was then hospitalized at [facility] in August 2022 with E. coli bacteremia and septic shock.  CT angiography during that admission did not show evidence of pulmonary embolism but showed cardiomegaly and pulmonary infiltrates suggestive of pulmonary edema.  She was seen in follow-up by Dr. Erin Fulling in the pulmonary clinic on 02/01/2021 for shortness of breath.  He ordered an echocardiogram that showed normal left ventricular systolic function (EF 12%) normal regional wall motion, mild LVH, grade 2 diastolic dysfunction (E/e' >15), no significant valvular abnormalities.  She has had problems with both orthopnea and paroxysmal nocturnal dyspnea, with varying degrees of severity off and on over the last few months.  She is receiving a very small dose of furosemide, 20 mg once daily.  She sometimes has lower extremity edema at the end of the day, but does not have any today.  This does cause noticeable increase in urine output after she takes it.  She does not have angina at rest or with activity.  She denies focal neurological complaints other than peripheral  neuropathy in the feet.  She has not had dizziness, syncope or palpitations.  She has type 2 diabetes that is currently being treated with a combination of insulin and glipizide.  Hypertension is being treated with lisinopril.  She is on a highly active statin.  She is not a smoker.  She is [place]  She does not have insurance.  She is accompanied by her daughter today, who until recently worked for [employer].    Past Medical History:  Diagnosis Date   Headache    "weekly" (09/29/2015)   High cholesterol    Hypertension    Obesity    Pneumonia ~ 2008   Type II diabetes mellitus [facility])     Past Surgical History:  Procedure Laterality Date   ABDOMINAL HYSTERECTOMY  03/2002   [place]; 2003    Current Medications: Current Meds  Medication Sig   atorvastatin (LIPITOR) 40 MG tablet atorvastatin 40 mg tablet  TAKE 1 TABLET BY MOUTH ONCE DAILY AT BEDTIME   dapagliflozin propanediol (FARXIGA) 10 MG TABS tablet Take 1 tablet (10 mg total) by mouth daily before breakfast.   gabapentin (NEURONTIN) 300 MG capsule gabapentin 300 mg capsule  TAKE 1 CAPSULE BY MOUTH EVERY DAY IN THE EVENING   glipiZIDE (GLUCOTROL) 10 MG tablet Take 10 mg by mouth daily before breakfast.   lisinopril (ZESTRIL) 10 MG tablet Take 10 mg by mouth daily.   nitrofurantoin, macrocrystal-monohydrate, (MACROBID) 100 MG capsule Take one cap PO Q12hr with food.   NOVOLIN  70/30 RELION (70-30) 100 UNIT/ML injection SMARTSIG:20 Unit(s) SUB-Q Twice Daily   [DISCONTINUED] furosemide (LASIX) 20 MG tablet Take 20 mg by mouth.     Allergies:   Patient has no known allergies.   Social History   Socioeconomic History   Marital status: Divorced    Spouse name: Not on file   Number of children: Not on file   Years of education: Not on file   Highest education level: Not on file  Occupational History   Not on file  Tobacco Use   Smoking status: Never   Smokeless tobacco: Never  Vaping Use    Vaping Use: Never used  Substance and Sexual Activity   Alcohol use: Yes   Drug use: No   Sexual activity: Yes    Birth control/protection: None  Other Topics Concern   Not on file  Social History Narrative   ** Merged History Encounter **       Social Determinants of Health   Financial Resource Strain: Not on file  Food Insecurity: Not on file  Transportation Needs: Not on file  Physical Activity: Not on file  Stress: Not on file  Social Connections: Not on file     Family History: The patient's family history includes Diabetes in her father; Hypertension in her father.  ROS:   Please see the history of present illness.     All other systems reviewed and are negative.  EKGs/Labs/Other Studies Reviewed:    The following studies were reviewed today: Echocardiogram 02/09/2021  1. Left ventricular ejection fraction, by estimation, is 60 to 65%. Left  ventricular ejection fraction by 3D volume is 61 %. The left ventricle has  normal function. The left ventricle has no regional wall motion  abnormalities. There is mild concentric  left ventricular hypertrophy. Left ventricular diastolic parameters are  consistent with Grade II diastolic dysfunction (pseudonormalization).   2. Right ventricular systolic function is normal. The right ventricular  size is normal. There is normal pulmonary artery systolic pressure.   3. The mitral valve is normal in structure. No evidence of mitral valve  regurgitation. No evidence of mitral stenosis.   4. The aortic valve has an indeterminant number of cusps. Aortic valve  regurgitation is not visualized. Mild aortic valve sclerosis is present,  with no evidence of aortic valve stenosis.   5. The inferior vena cava is normal in size with greater than 50%  respiratory variability, suggesting right atrial pressure of 3 mmHg.   Comparison(s): No prior Echocardiogram.   CTA chest (PE protocol), 12/16/2020 [facility]  IMPRESSION:  1. No  pulmonary embolus.  2. Cardiomegaly.  3. Bilateral infiltrate or edema    Note that CT of the abdomen pelvis during the same admission did not mention any aortic or other arterial atherosclerotic changes either.   CTA chest (PE protocol), 05/20/2020 [facility] 1.  No evidence of acute thromboembolic disease within the evaluated pulmonary arteries. Respiratory motion somewhat limits evaluation.  2.  Heavy burden of airspace disease with confluent groundglass opacification with more consolidative components within the posterior aspect of the bilateral upper and lower lobes suggestive of Covid 19 infection. Superimposed bacterial pneumonia not excluded.  3.  Indeterminate right adrenal gland nodule. Consider nonemergent outpatient imaging with adrenal protocol CT for further characterization if clinically indicated.    EKG:  EKG is ordered today.  The ekg ordered today demonstrates normal sinus rhythm, completely normal tracing, QTC 451 ms.  Recent Labs: No results found  for requested labs within last 8760 hours.  Recent Lipid Panel    Component Value Date/Time   CHOL 306 (H) 03/10/2015 0932   TRIG 295 (H) 03/10/2015 0932   HDL 51 03/10/2015 0932   CHOLHDL 6.0 (H) 03/10/2015 0932   VLDL 59 (H) 03/10/2015 0932   LDLCALC 196 (H) 03/10/2015 0932   Labs 08/12-17/2022 Hemoglobin A1c 12.7% (was 15.4% in January 2022) Potassium 3.9, creatinine 0.66, alk phos 213, normal transaminases and bilirubin, TSH 1.84, no proteinuria on UA Hemoglobin 10.1, platelet count 129K  Lipid profile 05/21/2020 Cholesterol 117, triglycerides 151, HDL 32, LDL 63  Risk Assessment/Calculations:           Physical Exam:    VS:  BP 116/62   Pulse 73   Ht 4' 8"  (1.422 m)   Wt 234 lb 12.8 oz (106.5 kg)   SpO2 99%   BMI 52.64 kg/m     Wt Readings from Last 3 Encounters:  03/10/21 234 lb 12.8 oz (106.5 kg)  03/06/21 225 lb (102.1 kg)  02/01/21 239 lb (108.4 kg)     GEN: Super obese,  well nourished, well developed in no acute distress HEENT: Normal NECK: Unable to see the jugular veins clearly due to obesity, scarring on the right from previous central lines. No carotid bruits LYMPHATICS: No lymphadenopathy CARDIAC: RRR, no murmurs, rubs, gallops RESPIRATORY:  Clear to auscultation without rales, wheezing or rhonchi  ABDOMEN: Soft, non-tender, non-distended MUSCULOSKELETAL:  No edema; No deformity  SKIN: Warm and dry NEUROLOGIC:  Alert and oriented x 3 PSYCHIATRIC:  Normal affect   ASSESSMENT:    1. Chronic diastolic heart failure ([place])   2. Essential hypertension   3. Dyslipidemia (high LDL; low HDL)   4. Poorly controlled diabetes mellitus ([place])   5. Super obese   6. Morbid obesity ([place])   7. Shortness of breath    PLAN:    In order of problems listed above:  CHF: Her symptoms are consistent with left heart failure.  Her echocardiogram shows convincing evidence of diastolic dysfunction with elevated filling pressures.  She does not have angina pectoris, ECG changes that would suggest ischemic heart disease or any atherosclerotic calcifications on numerous CT studies.  Although she does have multiple coronary risk factors (diabetes, low HDL, hypertension) she is also young and has a very good LDL cholesterol level on statin.  At this point coronary evaluation does not appear to be urgently indicated, although it would be an important part of her overall work-up.  It is quite possible that she has nonischemic cardiomyopathy due to obesity, hypertension and poorly controlled diabetes mellitus.  She appears to be hypervolemic.  We will increase her diuretic to 40 mg daily.  Continue lisinopril.  Ideally, she should be on Jardiance or Farxiga for both her diabetes and for heart failure. HTN: Well-controlled.  Continue lisinopril. HLP: HDL was 63 on current statin therapy.  Continue.  HDL will only improve with substantial weight loss and increase physical activity. DM:  Related to superobesity.  Poorly controlled.  Hemoglobin A1c was 12.7% in August, had been 15.4% in January.  Ideal control would be with SGLT2 inhibitors which would also help heart failure and GLP-1 agonist which would help with weight loss.  These are expensive medications that she may not be able to acquire. Superobesity: Underlies many of her chronic problems and increases the likelihood of poor outcome in the future.  Weight loss is essential. Suspected OSA: She underwent a sleep study on  02/16/2021 but the results are not yet available for review           Medication Adjustments/Labs and Tests Ordered: Current medicines are reviewed at length with the patient today.  Concerns regarding medicines are outlined above.  Orders Placed This Encounter  Procedures   Comprehensive metabolic panel   Hemoglobin A1c   Lipid panel   Brain natriuretic peptide   EKG 12-Lead   Meds ordered this encounter  Medications   dapagliflozin propanediol (FARXIGA) 10 MG TABS tablet    Sig: Take 1 tablet (10 mg total) by mouth daily before breakfast.    Dispense:  30 tablet    Refill:  11   furosemide (LASIX) 40 MG tablet    Sig: Take 1 tablet (40 mg total) by mouth daily.    Dispense:  30 tablet    Refill:  11    Patient Instructions  Medication Instructions:  START Farxiga 10 mg once daily  INCREASE Furosemide to 40 mg once daily  *If you need a refill on your cardiac medications before your next appointment, please call your pharmacy*  Lab Work: Your provider would like for you to return in 2-3 weeks to have the following labs drawn: CMET, BNP, lipid and A1C. You do not need an appointment for the lab. Once in our office lobby there is a podium where you can sign in and ring the doorbell to alert [place] that you are here. The lab is open from 8:00 am to 4:30 pm; closed for lunch from 12:45pm-1:45pm.  If you have labs (blood work) drawn today and your tests are completely normal, you will receive  your results only by: [place] (if you have MyChart) OR A paper copy in the mail If you have any lab test that is abnormal or we need to change your treatment, we will call you to review the results.   Testing/Procedures: None ordered   Follow-Up: At [facility], you and your health needs are our priority.  As part of our continuing mission to provide you with exceptional heart care, we have created designated Provider Care Teams.  These Care Teams include your primary Cardiologist (physician) and Advanced Practice Providers (APPs -  Physician Assistants and Nurse Practitioners) who all work together to provide you with the care you need, when you need it.  We recommend signing up for the patient portal called "MyChart".  Sign up information is provided on this After Visit Summary.  MyChart is used to connect with patients for Virtual Visits (Telemedicine).  Patients are able to view lab/test results, encounter notes, upcoming appointments, etc.  Non-urgent messages can be sent to your provider as well.   To learn more about what you can do with MyChart, go to [email or web address].    Your next appointment:   1 month(s)  The format for your next appointment:   In Person  Provider:   Sanda Klein, MD or an APP  Other Instructions  Your physician recommends that you weigh yourself everyday at the same time, on the same scale and with the same amount of clothing. Please keep a record of these weights and bring to your next appointment.  Plan de alimentacin cardiosaludable Heart-Healthy Eating Plan Muchos factores influyen en la salud del corazn (coronaria), entre ellos, los hbitos de alimentacin y de ejercicio fsico. El riesgo coronario aumenta cuando hay niveles anormales de grasa (lpidos) en la [place]. La planificacin de las comidas cardiosaludables implica limitar las grasas poco saludables, [place]  grasas saludables y hacer otros cambios en la dieta y el  estilo de vida. En qu consiste el plan? El mdico podra recomendarle que haga lo siguiente: Limitar la ingesta de grasas al _________ % o menos del total de caloras por da. Limitar la ingesta de grasas saturadas al _________ % o menos del total de caloras por da. Limitar la cantidad de colesterol en su dieta a menos de _________ mg por da. Cules son algunos consejos para seguir este plan? Al cocinar Evite frer los alimentos a la hora de la coccin. Hornear, hervir, grillar y asar a la parrilla son buenas opciones. Otras formas de reducir el consumo de grasas son las siguientes: Quite la piel de las aves. Quite todas las grasas visibles de las carnes. Cocine al vapor las verduras en agua o caldo. Planificacin de las comidas  En las comidas, imagine dividir su plato en cuartos: Llene la mitad del plato con verduras y ensaladas de hojas verdes. Llene un cuarto del plato con cereales integrales. Llene un cuarto del plato con alimentos con protenas magras. Coma 4 o 5 porciones de verduras por da. Una porcin equivale a una taza de verduras crudas o cocidas o a 2 tazas de verduras de hojas verdes crudas. Coma 4 o 5 porciones de frutas por da. Una porcin equivale a una fruta mediana entera,  taza de fruta desecada;  taza de frutas frescas, congeladas o enlatadas; o  taza de jugo 100 % de fruta. Consuma ms alimentos con fibra soluble. Entre ellos, se incluyen las [place], el [place], las [place], los frijoles, los guisantes y Nature conservation officer. Trate de consumir de 25 a 30 g de [employer] Stores. Aumente el consumo de legumbres, frutos secos y semillas a 4 o 5 porciones por semana. Una porcin de frijoles o legumbres secos equivale a  taza despus de su coccin, una porcin de frutos secos equivale a  de taza y [place] porcin de semillas equivale a 1 cucharada. Grasas Elija grasas saludables con mayor frecuencia. Elija las grasas monoinsaturadas y poliinsaturadas, como el aceite de oliva y  el de canola, las semillas de [place], las nueces, las almendras y las semillas. Consuma ms grasas omega-3. Elija salmn, caballa, sardinas, atn, aceite de lino y semillas de lino molidas. Propngase comer pescado al [employer] veces por semana. Lea las etiquetas de los alimentos detenidamente para identificar los que contienen grasas trans o altas cantidades de grasas saturadas. Limite el consumo de grasas saturadas. Estas se encuentran en productos de origen animal, como carnes, mantequilla y crema. Las grasas saturadas de origen vegetal incluyen aceite de palma, de palmiste y de coco. Evite los alimentos con aceites parcialmente hidrogenados. Estos contienen grasas trans. [address] [place], se incluyen margarinas en barra, algunas margarinas untables, galletas dulces y [place] y otros productos horneados. Evite las comidas fritas. Informacin general Consuma ms comida casera y menos de restaurante, de bares y comida rpida. Limite o evite el alcohol. Limite los alimentos con alto contenido de almidn y Location manager. Baje de peso si es necesario. Perder solo del 5 al 10 % de su peso corporal puede ayudarlo a mejorar su estado de salud general y a Producer, television/film/video, como la diabetes y las enfermedades cardacas. Controle la ingesta de sal (sodio), especialmente si tiene presin arterial alta. Hable con el mdico acerca de cambiar la ingesta de sodio. Intente incorporar ms comidas vegetarianas cada semana. Qu alimentos puedo comer? Lambert Mody Lambert Mody frescas, en conserva (en su jugo natural) o frutas congeladas.  Verduras Verduras frescas o congeladas (crudas, al vapor, asadas o grilladas). Ensaladas de hojas verdes. Cereales La mayora de los cereales. Elija casi siempre trigo integral y Psychologist, prison and probation services. Arroz y pastas, incluido el arroz integral y las pastas elaboradas con trigo integral. Estate agent y otras protenas Carnes magras de res, ternera, cerdo y cordero a las que se les haya quitado la  grasa visible. Pollo y pavo sin piel. Todos los pescados y [employer]. Pato salvaje, conejo, faisn y venado. Claras de huevo o sustitutos del huevo bajos en colesterol. Porotos, guisantes y lentejas secos y tofu. Semillas y la mayora de los frutos secos. Lcteos Quesos descremados y semidescremados, entre ellos, ricota y Artist. Leche descremada o al 1 % (lquida, en polvo o evaporada). [place]. Yogur descremado o semidescremado. Grasas y Naval architect no hidrogenadas (sin grasas trans). Aceites vegetales, incluido el de soja, ssamo, girasol, [place], man, crtamo, maz, canola y semillas de algodn. Alios para ensalada o mayonesa elaborados con aceite vegetal. Billie Lade (mineral o con gas). T y caf. Gaseosas dietticas. Dulces y [employer], gelatina y helado de frutas. Pequeas cantidades de chocolate amargo. Limite todos los dulces y postres. Alios y [employer] y condimentos. Es posible que los productos que se enumeran ms [place] no constituyan una lista completa de los alimentos y las bebidas que puede tomar. Consulte a un nutricionista para conocer ms opciones. Qu alimentos no se recomiendan? Lambert Mody Fruta enlatada en almbar espeso. Frutas con salsa de crema o mantequilla. Frutas cocidas en aceite. Limite el consumo de coco. Verduras Verduras cocinadas con salsas de queso, crema o mantequilla. Verduras fritas. Cereales Panes elaborados con grasas saturadas o trans, aceites o Mattel. Croissants. Panecillos dulces. [place]. Galletas con alto contenido de grasas, como las que contienen [place]. Carnes y otras protenas Carnes grasas, como perros calientes, [place] de res, salchichas, tocino, asado de Engineer, structural o [place]. Fiambres con alto contenido de [place], como salame y Archivist. Caviar. Pato y ganso domsticos. Vsceras, como hgado. Lcteos Crema, crema agria, queso crema y [place] con  crema. Quesos enteros. Leche entera o al 2 % (lquida, evaporada o condensada). Suero de [employer]. Salsa de crema o queso con alto contenido de [place]. [place] Grasas y [place] de carne o materia grasa. [place] de cacao, aceites hidrogenados, aceite de palma, aceite de coco, aceite de palmiste. Grasas y materias grasas slidas, incluida la grasa del tocino, el cerdo [place], la [place] de cerdo y Community education officer. Sustitutos no lcteos de la crema. Aderezos para ensalada con queso o crema agria. Bebidas Refrescos regulares y cualquier bebida con agregado de azcar. Dulces y [employer]. Pudin. Galletas dulces. Bizcochuelos. Pasteles. Chocolate con leche o chocolate blanco. Almbares con mantequilla. Helados o bebidas elaboradas con helado con alto contenido de grasas. Es posible que los productos que se enumeran ms arriba no sean una lista completa de los alimentos y las bebidas que se Higher education careers adviser. Consulte a un nutricionista para obtener ms informacin. Resumen La planificacin de las comidas cardiosaludables implica limitar las grasas poco saludables, aumentar las grasas saludables y Field seismologist otros cambios en la dieta y el estilo de [place]. Baje de peso si es necesario. Perder solo del 5 al 10 % de su peso corporal puede ayudarlo a mejorar su estado de salud general y a Producer, television/film/video, como la diabetes y las enfermedades cardacas. Propngase seguir una dieta equilibrada, que incluya frutas y verduras, productos lcteos descremados o semidescremados,  protenas magras, frutos secos y legumbres, cereales integrales y aceites y grasas cardiosaludables. Esta informacin no tiene Marine scientist el consejo del mdico. Asegrese de hacerle al mdico cualquier pregunta que tenga. Document Revised: 09/02/2020 Document Reviewed: 09/02/2020 Elsevier Patient Education  2022 Coleman, Sanda Klein, MD  03/12/2021 10:05 AM    [place]

## 2021-03-10 NOTE — Patient Instructions (Addendum)
Medication Instructions:  START Farxiga 10 mg once daily  INCREASE Furosemide to 40 mg once daily  *If you need a refill on your cardiac medications before your next appointment, please call your pharmacy*  Lab Work: Your provider would like for you to return in 2-3 weeks to have the following labs drawn: CMET, BNP, lipid and A1C. You do not need an appointment for the lab. Once in our office lobby there is a podium where you can sign in and ring the doorbell to alert [place] that you are here. The lab is open from 8:00 am to 4:30 pm; closed for lunch from 12:45pm-1:45pm.  If you have labs (blood work) drawn today and your tests are completely normal, you will receive your results only by: MyChart Message (if you have MyChart) OR A paper copy in the mail If you have any lab test that is abnormal or we need to change your treatment, we will call you to review the results.   Testing/Procedures: None ordered   Follow-Up: At [facility], you and your health needs are our priority.  As part of our continuing mission to provide you with exceptional heart care, we have created designated Provider Care Teams.  These Care Teams include your primary Cardiologist (physician) and Advanced Practice Providers (APPs -  Physician Assistants and Nurse Practitioners) who all work together to provide you with the care you need, when you need it.  We recommend signing up for the patient portal called "MyChart".  Sign up information is provided on this After Visit Summary.  MyChart is used to connect with patients for Virtual Visits (Telemedicine).  Patients are able to view lab/test results, encounter notes, upcoming appointments, etc.  Non-urgent messages can be sent to your provider as well.   To learn more about what you can do with MyChart, go to [email or web address].    Your next appointment:   1 month(s)  The format for your next appointment:   In Person  Provider:   Thurmon Fair, MD or an  APP  Other Instructions  Your physician recommends that you weigh yourself everyday at the same time, on the same scale and with the same amount of clothing. Please keep a record of these weights and bring to your next appointment.  Plan de alimentacin cardiosaludable Heart-Healthy Eating Plan Muchos factores influyen en la salud del corazn (coronaria), entre ellos, los hbitos de alimentacin y de ejercicio fsico. El riesgo coronario aumenta cuando hay niveles anormales de grasa (lpidos) en la [place]. La planificacin de las comidas cardiosaludables implica limitar las grasas poco saludables, aumentar las grasas saludables y Radio producer otros cambios en la dieta y el estilo de [place]. En qu consiste el plan? El mdico podra recomendarle que haga lo siguiente: Limitar la ingesta de grasas al _________ % o menos del total de caloras por da. Limitar la ingesta de grasas saturadas al _________ % o menos del total de caloras por da. Limitar la cantidad de colesterol en su dieta a menos de _________ mg por da. Cules son algunos consejos para seguir este plan? Al cocinar Evite frer los alimentos a la hora de la coccin. Hornear, hervir, grillar y asar a la parrilla son buenas opciones. Otras formas de reducir el consumo de grasas son las siguientes: Quite la piel de las aves. Quite todas las grasas visibles de las carnes. Cocine al vapor las verduras en agua o caldo. Planificacin de las comidas  En las comidas, imagine dividir su plato en cuartos: Llene  la mitad del plato con verduras y ensaladas de hojas verdes. Llene un cuarto del plato con cereales integrales. Llene un cuarto del plato con alimentos con protenas magras. Coma 4 o 5 porciones de verduras por da. Una porcin equivale a una taza de verduras crudas o cocidas o a 2 tazas de verduras de hojas verdes crudas. Coma 4 o 5 porciones de frutas por da. Una porcin equivale a una fruta mediana entera,  taza de fruta desecada;   taza de frutas frescas, congeladas o enlatadas; o  taza de jugo 100 % de fruta. Consuma ms alimentos con fibra soluble. Entre ellos, se incluyen las [place], el [place], las [place], los frijoles, los guisantes y Aeronautical engineer. Trate de consumir de 25 a 30 g de [employer]. Aumente el consumo de legumbres, frutos secos y semillas a 4 o 5 porciones por semana. Una porcin de frijoles o legumbres secos equivale a  taza despus de su coccin, una porcin de frutos secos equivale a  de taza y [place] porcin de semillas equivale a 1 cucharada. Grasas Elija grasas saludables con mayor frecuencia. Elija las grasas monoinsaturadas y [address], como el aceite de oliva y el de canola, las semillas de [place], las nueces, las almendras y las semillas. Consuma ms grasas omega-3. Elija salmn, caballa, sardinas, atn, aceite de lino y semillas de lino molidas. Propngase comer pescado al [employer] veces por semana. Lea las etiquetas de los alimentos detenidamente para identificar los que contienen grasas trans o altas cantidades de grasas saturadas. Limite el consumo de grasas saturadas. Estas se encuentran en productos de origen animal, como carnes, mantequilla y crema. Las grasas saturadas de origen vegetal incluyen aceite de palma, de palmiste y de coco. Evite los alimentos con aceites parcialmente hidrogenados. Estos contienen grasas trans. [address] [place], se incluyen margarinas en barra, algunas margarinas untables, galletas dulces y [place] y otros productos horneados. Evite las comidas fritas. Informacin general Consuma ms comida casera y menos de restaurante, de bares y comida rpida. Limite o evite el alcohol. Limite los alimentos con alto contenido de almidn y International aid/development worker. Baje de peso si es necesario. Perder solo del 5 al 10 % de su peso corporal puede ayudarlo a mejorar su estado de salud general y a Education officer, museum, como la diabetes y las enfermedades cardacas. Controle la ingesta de  sal (sodio), especialmente si tiene presin arterial alta. Hable con el mdico acerca de cambiar la ingesta de sodio. Intente incorporar ms comidas vegetarianas cada semana. Qu alimentos puedo comer? Nils Pyle Nils Pyle frescas, en conserva (en su jugo natural) o frutas congeladas. Verduras Verduras frescas o congeladas (crudas, al vapor, asadas o grilladas). Ensaladas de hojas verdes. Cereales La mayora de los cereales. Elija casi siempre trigo integral y Radiation protection practitioner. Arroz y pastas, incluido el arroz integral y las pastas elaboradas con trigo integral. Armed forces operational officer y otras protenas Carnes magras de res, ternera, cerdo y cordero a las que se les haya quitado la grasa visible. Pollo y pavo sin piel. Todos los pescados y [employer]. Pato salvaje, conejo, faisn y venado. Claras de huevo o sustitutos del huevo bajos en colesterol. Porotos, guisantes y lentejas secos y tofu. Semillas y la mayora de los frutos secos. Lcteos Quesos descremados y semidescremados, entre ellos, ricota y Garment/textile technologist. Leche descremada o al 1 % (lquida, en polvo o evaporada). Suero de [employer] elaborado con [employer]. Yogur descremado o semidescremado. Grasas y Writer no hidrogenadas (sin grasas trans). Aceites vegetales, incluido el de soja, ssamo, girasol,  oliva, man, crtamo, maz, canola y semillas de algodn. Alios para ensalada o mayonesa elaborados con aceite vegetal. Halina Andreas (mineral o con gas). T y caf. Gaseosas dietticas. Dulces y [employer], gelatina y helado de frutas. Pequeas cantidades de chocolate amargo. Limite todos los dulces y postres. Alios y [employer] y condimentos. Es posible que los productos que se enumeran ms [place] no constituyan una lista completa de los alimentos y las bebidas que puede tomar. Consulte a un nutricionista para conocer ms opciones. Qu alimentos no se recomiendan? Nils Pyle Fruta enlatada en almbar espeso. Frutas con  salsa de crema o mantequilla. Frutas cocidas en aceite. Limite el consumo de coco. Verduras Verduras cocinadas con salsas de queso, crema o mantequilla. Verduras fritas. Cereales Panes elaborados con grasas saturadas o trans, aceites o [employer]ak. Croissants. Panecillos dulces. Rosquillas. Galletas con alto contenido de grasas, como las que contienen [place]. Carnes y [address] protenas [address] grasas, como perros calientes, [place] de res, salchichas, tocino, asado de Producer, television/film/video o [place]. Fiambres con alto contenido de [place], como salame y Loss adjuster, chartered. Caviar. Pato y ganso domsticos. Vsceras, como hgado. Lcteos Crema, crema agria, queso crema y [place] con crema. Quesos enteros. Leche entera o al 2 % (lquida, evaporada o condensada). Suero de [employer]. Salsa de crema o queso con alto contenido de [place]. Yogur de East[employer] Kodak. Grasas y [place] de carne o materia grasa. Manteca de cacao, aceites hidrogenados, aceite de palma, aceite de coco, aceite de palmiste. Grasas y [address] grasas slidas, incluida la grasa del tocino, el cerdo [place], la [place] de cerdo y Civil engineer, contracting. Sustitutos no lcteos de la crema. Aderezos para ensalada con queso o crema agria. Bebidas Refrescos regulares y cualquier bebida con agregado de azcar. Dulces y [employer]. Pudin. Galletas dulces. Bizcochuelos. Pasteles. Chocolate con leche o chocolate blanco. Almbares con mantequilla. Helados o bebidas elaboradas con helado con alto contenido de grasas. Es posible que los productos que se enumeran ms arriba no sean una lista completa de los alimentos y las bebidas que se Theatre stage manager. Consulte a un nutricionista para obtener ms informacin. Resumen La planificacin de las comidas cardiosaludables implica limitar las grasas poco saludables, aumentar las grasas saludables y Radio producer otros cambios en la dieta y el estilo de [place]. Baje de peso si es necesario. Perder solo del 5 al 10 % de su peso  corporal puede ayudarlo a mejorar su estado de salud general y a Education officer, museum, como la diabetes y las enfermedades cardacas. Propngase seguir una dieta equilibrada, que incluya frutas y verduras, productos lcteos descremados o semidescremados, protenas magras, frutos secos y legumbres, cereales integrales y aceites y grasas cardiosaludables. Esta informacin no tiene Theme park manager el consejo del mdico. Asegrese de hacerle al mdico cualquier pregunta que tenga. Document Revised: 09/02/2020 Document Reviewed: 09/02/2020 Elsevier Patient Education  2022 [employer].

## 2021-04-11 ENCOUNTER — Telehealth: Payer: Self-pay

## 2021-04-11 NOTE — Telephone Encounter (Signed)
Attempted to call patient, mailbox not set up. Will call patient back at later time to schedule appointment.

## 2021-04-18 ENCOUNTER — Ambulatory Visit: Payer: Self-pay

## 2021-04-19 NOTE — Progress Notes (Signed)
Cardiology Clinic Note   Patient Name: Mandy Garza Date of Encounter: 04/21/2021  Primary Care Provider:  Sandre Kitty, PA-C Primary Cardiologist:  Thurmon Fair, MD  Patient Profile    Mandy Garza 50 year old female presents the clinic today for follow-up evaluation of her essential hypertension and hyperlipidemia.  Past Medical History    Past Medical History:  Diagnosis Date   Headache    "weekly" (09/29/2015)   High cholesterol    Hypertension    Obesity    Pneumonia ~ 2008   Type II diabetes mellitus (HCC)    Past Surgical History:  Procedure Laterality Date   ABDOMINAL HYSTERECTOMY  03/2002   CESAREAN SECTION  1998; 2003    Allergies  No Known Allergies  History of Present Illness    Mandy Garza has a PMH of essential hypertension, TKA, type 2 diabetes, AKI, thrombocytopenia, hyponatremia, hyperlipidemia, obesity, and sepsis.  She had a COVID-19 infection 1/22 during that time she was noted to have HFpEF and acute respiratory failure.  She was hospitalized at that time [facility].  She is from [place], [place].  She previously did not have insurance.  She was admitted again 8/22 at [facility] with E. coli bacterium and septic shock.  A CT angio showed no evidence of pulmonary embolism but did show cardiomegaly and pulmonary infiltrates suggestive of pulmonary edema.  She followed up with Dr. Francine Graven in the pulmonary clinic 9/22 for shortness of breath.  He ordered an echocardiogram which showed normal LV EF around 61%, mild LVH, G2 DD, and no significant valvular abnormalities.  On follow-up with Dr. Royann Shivers 03/10/2021 she was noted to have orthopnea and PND.  She reported varying degrees of severity over the last few months.  She occasionally noted lower extremity edema through the day.  On exam she was euvolemic.  She was having adequate urine output with her furosemide.  She denied chest pain, dizziness, presyncope,  palpitations.  She presents to the clinic today for follow-up evaluation states she feels well.  She feels that her breathing has improved.  She does notice some dizziness with changes in head position.  She has been trying to increase her physical activity.  We reviewed her current medications and she denied side effects.  We reviewed her recent lab work.  I will have her increase her physical activity as tolerated, give Epley maneuvers, have her maintain p.o. hydration, and plan follow-up for 6 months.  Today she denies chest pain, shortness of breath, lower extremity edema, fatigue, palpitations, melena, hematuria, hemoptysis, diaphoresis, weakness, presyncope, syncope, orthopnea, and PND.   Home Medications    Prior to Admission medications   Medication Sig Start Date End Date Taking? Authorizing Provider  atorvastatin (LIPITOR) 40 MG tablet atorvastatin 40 mg tablet  TAKE 1 TABLET BY MOUTH ONCE DAILY AT BEDTIME 01/01/20   [provider]  dapagliflozin propanediol (FARXIGA) 10 MG TABS tablet Take 1 tablet (10 mg total) by mouth daily before breakfast. 03/10/21   Croitoru, Mihai, MD  furosemide (LASIX) 40 MG tablet Take 1 tablet (40 mg total) by mouth daily. 03/10/21   Croitoru, Mihai, MD  gabapentin (NEURONTIN) 300 MG capsule gabapentin 300 mg capsule  TAKE 1 CAPSULE BY MOUTH EVERY DAY IN THE EVENING    [provider]  glipiZIDE (GLUCOTROL) 10 MG tablet Take 10 mg by mouth daily before breakfast.    [provider]  lisinopril (ZESTRIL) 10 MG tablet Take 10 mg by mouth daily.  [provider]  nitrofurantoin, macrocrystal-monohydrate, (MACROBID) 100 MG capsule Take one cap PO Q12hr with food. 03/06/21   Kandra Nicolas, MD  NOVOLIN 70/30 RELION (70-30) 100 UNIT/ML injection SMARTSIG:20 Unit(s) SUB-Q Twice Daily 12/21/20   [provider]    Family History    Family History  Problem Relation Age of Onset   Diabetes Father    Hypertension  Father    She indicated that her mother is deceased. She indicated that her father is alive.  Social History    Social History   Socioeconomic History   Marital status: Divorced    Spouse name: Not on file   Number of children: Not on file   Years of education: Not on file   Highest education level: Not on file  Occupational History   Not on file  Tobacco Use   Smoking status: Never   Smokeless tobacco: Never  Vaping Use   Vaping Use: Never used  Substance and Sexual Activity   Alcohol use: Yes   Drug use: No   Sexual activity: Yes    Birth control/protection: None  Other Topics Concern   Not on file  Social History Narrative   ** Merged History Encounter **       Social Determinants of Health   Financial Resource Strain: Not on file  Food Insecurity: Not on file  Transportation Needs: Not on file  Physical Activity: Not on file  Stress: Not on file  Social Connections: Not on file  Intimate Partner Violence: Not on file     Review of Systems    General:  No chills, fever, night sweats or weight changes.  Cardiovascular:  No chest pain, dyspnea on exertion, edema, orthopnea, palpitations, paroxysmal nocturnal dyspnea. Dermatological: No rash, lesions/masses Respiratory: No cough, dyspnea Urologic: No hematuria, dysuria Abdominal:   No nausea, vomiting, diarrhea, bright red blood per rectum, melena, or hematemesis Neurologic:  No visual changes, wkns, changes in mental status. All other systems reviewed and are otherwise negative except as noted above.  Physical Exam    VS:  BP 128/78    Pulse 71    Ht 4\' 8"  (1.422 m)    Wt 236 lb (107 kg)    SpO2 96%    BMI 52.91 kg/m  , BMI Body mass index is 52.91 kg/m. GEN: Well nourished, well developed, in no acute distress. HEENT: normal. Neck: Supple, no JVD, carotid bruits, or masses. Cardiac: RRR, no murmurs, rubs, or gallops. No clubbing, cyanosis, edema.  Radials/DP/PT 2+ and equal bilaterally.  Respiratory:   Respirations regular and unlabored, clear to auscultation bilaterally. GI: Soft, nontender, nondistended, BS + x 4. MS: no deformity or atrophy. Skin: warm and dry, no rash. Neuro:  Strength and sensation are intact. Psych: Normal affect.  Accessory Clinical Findings    Recent Labs: 04/19/2021: ALT 31; BNP 35.7; BUN 18; Creatinine, Ser 0.79; Potassium 5.0; Sodium 138   Recent Lipid Panel    Component Value Date/Time   CHOL 169 04/19/2021 1057   TRIG 271 (H) 04/19/2021 1057   HDL 44 04/19/2021 1057   CHOLHDL 3.8 04/19/2021 1057   CHOLHDL 6.0 (H) 03/10/2015 0932   VLDL 59 (H) 03/10/2015 0932   LDLCALC 81 04/19/2021 1057    ECG personally reviewed by me today-none today.  Echocardiogram 02/09/2021 IMPRESSIONS     1. Left ventricular ejection fraction, by estimation, is 60 to 65%. Left  ventricular ejection fraction by 3D volume is 61 %. The left ventricle has  normal function. The left ventricle has no regional wall motion  abnormalities. There is mild concentric  left ventricular hypertrophy. Left ventricular diastolic parameters are  consistent with Grade II diastolic dysfunction (pseudonormalization).   2. Right ventricular systolic function is normal. The right ventricular  size is normal. There is normal pulmonary artery systolic pressure.   3. The mitral valve is normal in structure. No evidence of mitral valve  regurgitation. No evidence of mitral stenosis.   4. The aortic valve has an indeterminant number of cusps. Aortic valve  regurgitation is not visualized. Mild aortic valve sclerosis is present,  with no evidence of aortic valve stenosis.   5. The inferior vena cava is normal in size with greater than 50%  respiratory variability, suggesting right atrial pressure of 3 mmHg.   Comparison(s): No prior Echocardiogram.  Assessment & Plan   1.  Chronic diastolic CHF-euvolemic today.  Weight stable.  No increased DOE or activity intolerance. Continue furosemide,  Farxiga, lisinopril Heart healthy low-sodium diet-salty 6 given Increase physical activity as tolerated Daily weights-contact office with a weight increase of 3 pounds overnight or 5 pounds in 1 week.  Dizziness-notices dizziness with changes in her head positions.  Reviewed Epley maneuvers Epley maneuver sheet given Maintain p.o. hydration Follow-up with PCP if no improvement  Essential hypertension-BP today 128/78.  Well-controlled at home. Continue lisinopril, furosemide Heart healthy low-sodium diet-salty 6 given Increase physical activity as tolerated  Dyspnea on exertion-stable.  Continues to work on increased physical activity and more strict diet.  Echocardiogram 02/09/2021 showed normal LVEF and G2 DD. Continue weight loss Increase physical activity as tolerated  Hyperlipidemia-reports compliance with statin therapy  continue atorvastatin Heart healthy low-sodium diet-salty 6 given Increase physical activity as tolerated Follows with PCP  Morbid obesity-weight today 236 lbs.   Calorie restricted diet Increase physical activity as tolerated Continue weight loss  Disposition: Follow-up with Dr. Sallyanne Kuster in 4 to 6 months.   Jossie Ng. Makiya Jeune NP-C    04/21/2021, 9:04 AM [place] [address] Office [phone] Fax [phone]  Notice: This dictation was prepared with Dragon dictation along with smaller phrase technology. Any transcriptional errors that result from this process are unintentional and may not be corrected upon review.  I spent 13 minutes examining this patient, reviewing medications, and using patient centered shared decision making involving her cardiac care.  Prior to her visit I spent greater than 20 minutes reviewing her past medical history,  medications, and prior cardiac tests.

## 2021-04-20 LAB — LIPID PANEL
Chol/HDL Ratio: 3.8 ratio (ref 0.0–4.4)
Cholesterol, Total: 169 mg/dL (ref 100–199)
HDL: 44 mg/dL (ref >39)
LDL Chol Calc (NIH): 81 mg/dL (ref 0–99)
Triglycerides: 271 mg/dL — ABNORMAL HIGH (ref 0–149)
VLDL Cholesterol Cal: 44 mg/dL — ABNORMAL HIGH (ref 5–40)

## 2021-04-20 LAB — COMPREHENSIVE METABOLIC PANEL
ALT: 31 IU/L (ref 0–32)
AST: 36 IU/L (ref 0–40)
Albumin/Globulin Ratio: 1.3 (ref 1.2–2.2)
Albumin: 4.2 g/dL (ref 3.8–4.8)
Alkaline Phosphatase: 199 IU/L — ABNORMAL HIGH (ref 44–121)
BUN/Creatinine Ratio: 23 (ref 9–23)
BUN: 18 mg/dL (ref 6–24)
Bilirubin Total: 0.3 mg/dL (ref 0.0–1.2)
CO2: 24 mmol/L (ref 20–29)
Calcium: 8.8 mg/dL (ref 8.7–10.2)
Chloride: 99 mmol/L (ref 96–106)
Creatinine, Ser: 0.79 mg/dL (ref 0.57–1.00)
Globulin, Total: 3.3 g/dL (ref 1.5–4.5)
Glucose: 241 mg/dL — ABNORMAL HIGH (ref 70–99)
Potassium: 5 mmol/L (ref 3.5–5.2)
Sodium: 138 mmol/L (ref 134–144)
Total Protein: 7.5 g/dL (ref 6.0–8.5)
eGFR: 92 mL/min/{1.73_m2} (ref >59)

## 2021-04-20 LAB — HEMOGLOBIN A1C
Est. average glucose Bld gHb Est-mCnc: 260 mg/dL
Hgb A1c MFr Bld: 10.7 % — ABNORMAL HIGH (ref 4.8–5.6)

## 2021-04-20 LAB — BRAIN NATRIURETIC PEPTIDE: BNP: 35.7 pg/mL (ref 0.0–100.0)

## 2021-04-21 ENCOUNTER — Ambulatory Visit (INDEPENDENT_AMBULATORY_CARE_PROVIDER_SITE_OTHER): Payer: Self-pay | Admitting: General Practice

## 2021-04-21 ENCOUNTER — Other Ambulatory Visit: Payer: Self-pay

## 2021-04-21 ENCOUNTER — Encounter: Payer: Self-pay | Admitting: General Practice

## 2021-04-21 VITALS — BP 128/78 | HR 71 | Ht <= 58 in | Wt 236.0 lb

## 2021-04-21 DIAGNOSIS — I5032 Chronic diastolic (congestive) heart failure: Secondary | ICD-10-CM

## 2021-04-21 DIAGNOSIS — I10 Essential (primary) hypertension: Secondary | ICD-10-CM

## 2021-04-21 DIAGNOSIS — E785 Hyperlipidemia, unspecified: Secondary | ICD-10-CM

## 2021-04-21 DIAGNOSIS — R0602 Shortness of breath: Secondary | ICD-10-CM

## 2021-04-21 NOTE — Patient Instructions (Signed)
Medication Instructions:  The current medical regimen is effective;  continue present plan and medications as directed. Please refer to the Current Medication list given to you today.   *If you need a refill on your cardiac medications before your next appointment, please call your pharmacy*  Lab Work:   Testing/Procedures:  NONE    NONE  Special Instructions PLEASE READ AND FOLLOW SALTY 6-ATTACHED-1,800mg  daily  PLEASE INCREASE PHYSICAL ACTIVITY AS TOLERATED   PLEASE READ AND FOLLOW EPLEY MANEUVERS-ATTACHED, IF DIZZINESS DOES NOT IMPROVE CONTACT YOUR PRIMARY MD  CONTINUE WEIGHT LOSS  Follow-Up: Your next appointment:  4-6 month(s) In Person with Thurmon Fair, MD :1  At [facility], you and your health needs are our priority.  As part of our continuing mission to provide you with exceptional heart care, we have created designated Provider Care Teams.  These Care Teams include your primary Cardiologist (physician) and Advanced Practice Providers (APPs -  Physician Assistants and Nurse Practitioners) who all work together to provide you with the care you need, when you need it.            6 SALTY THINGS TO AVOID     1,800MG  DAILY      How to Perform the Epley Maneuver The Epley maneuver is an exercise that relieves symptoms of vertigo. Vertigo is the feeling that you or your surroundings are moving when they are not. When you feel vertigo, you may feel like the room is spinning and may have trouble walking. The Epley maneuver is used for a type of vertigo caused by a calcium deposit in a part of the inner ear. The maneuver involves changing head positions to help the deposit move out of the area. You can do this maneuver at home whenever you have symptoms of vertigo. You can repeat it in 24 hours if your vertigo has not gone away. Even though the Epley maneuver may relieve your vertigo for a few weeks, it is possible that your symptoms will return. This maneuver relieves vertigo,  but it does not relieve dizziness. What are the risks? If it is done correctly, the Epley maneuver is considered safe. Sometimes it can lead to dizziness or nausea that goes away after a short time. If you develop other symptoms--such as changes in vision, weakness, or numbness--stop doing the maneuver and call your health care provider. Supplies needed: A bed or table. A pillow. How to do the Epley maneuver   Sit on the edge of a bed or table with your back straight and your legs extended or hanging over the edge of the bed or table. Turn your head halfway toward the affected ear or side as told by your health care provider. Lie backward quickly with your head turned until you are lying flat on your back. Your head should dangle (head-hanging position). You may want to position a pillow under your shoulders. Hold this position for at least 30 seconds. If you feel dizzy or have symptoms of vertigo, continue to hold the position until the symptoms stop. Turn your head to the opposite direction until your unaffected ear is facing down. Your head should continue to dangle. Hold this position for at least 30 seconds. If you feel dizzy or have symptoms of vertigo, continue to hold the position until the symptoms stop. Turn your whole body to the same side as your head so that you are positioned on your side. Your head will now be nearly facedown and no longer needs to dangle.  Hold for at least 30 seconds. If you feel dizzy or have symptoms of vertigo, continue to hold the position until the symptoms stop. Sit back up. You can repeat the maneuver in 24 hours if your vertigo does not go away. Follow these instructions at home: For 24 hours after doing the Epley maneuver: Keep your head in an upright position. When lying down to sleep or rest, keep your head raised (elevated) with two or more pillows. Avoid excessive neck movements. Activity Do not drive or use machinery if you feel dizzy. After  doing the Epley maneuver, return to your normal activities as told by your health care provider. Ask your health care provider what activities are safe for you. General instructions Drink enough fluid to keep your urine pale yellow. Do not drink alcohol. Take over-the-counter and prescription medicines only as told by your health care provider. Keep all follow-up visits. This is important. Preventing vertigo symptoms Ask your health care provider if there is anything you should do at home to prevent vertigo. He or she may recommend that you: Keep your head elevated with two or more pillows while you sleep. Do not sleep on the side of your affected ear. Get up slowly from bed. Avoid sudden movements during the day. Avoid extreme head positions or movement, such as looking up or bending over. Contact a health care provider if: Your vertigo gets worse. You have other symptoms, including: Nausea. Vomiting. Headache. Get help right away if you: Have vision changes. Have a headache or neck pain that is severe or getting worse. Cannot stop vomiting. Have new numbness or weakness in any part of your body. These symptoms may represent a serious problem that is an emergency. Do not wait to see if the symptoms will go away. Get medical help right away. Call your local emergency services (911 in the [place] Do not drive yourself to the hospital. Summary Vertigo is the feeling that you or your surroundings are moving when they are not. The Epley maneuver is an exercise that relieves symptoms of vertigo. If the Epley maneuver is done correctly, it is considered safe. This information is not intended to replace advice given to you by your health care provider. Make sure you discuss any questions you have with your health care provider. Document Revised: 03/23/2020 Document Reviewed: 03/23/2020 Elsevier Patient Education  2022 [employer].

## 2021-04-25 ENCOUNTER — Other Ambulatory Visit: Payer: Self-pay

## 2021-04-25 ENCOUNTER — Ambulatory Visit: Payer: Self-pay | Admitting: *Deleted

## 2021-04-25 VITALS — BP 112/76 | Wt 235.0 lb

## 2021-04-25 DIAGNOSIS — Z1239 Encounter for other screening for malignant neoplasm of breast: Secondary | ICD-10-CM

## 2021-04-25 DIAGNOSIS — Z1211 Encounter for screening for malignant neoplasm of colon: Secondary | ICD-10-CM

## 2021-04-25 NOTE — Progress Notes (Signed)
Mandy Garza is a 50 y.o. female who presents to [facility] clinic today with complaint of two left breast lumps x one month that are painful. Patient stated the pain comes and goes. Patient rates the pain at a 7-8 out 10. Patient complained of right breast pain x 2 weeks that comes and goes. Patient rates the pain at a 5 out of 10. Patient referred to [facility] by [facility] due to having a bilateral diagnostic mammogram and ultrasound completed 04/04/2021 that a right breast stereotactic biopsy is recommended for follow-up.   Pap Smear: Pap smear not completed today. Last Pap smear was 05/14/2018 at [facility] clinic and was normal per patient. Per patient has no history of an abnormal Pap smear. Patient has a history of a supracervical hysterectomy in November 2003 due to menorrhagia. Last Pap smear result is not available in Epic.   Physical exam: Breasts Breasts symmetrical. No skin abnormalities left breast. Two healed sores right breast at 12 o'clock above areola. No nipple retraction bilateral breasts. No nipple discharge bilateral breasts. No lymphadenopathy. No lumps palpated bilateral breasts. Unable to palpate a lump in patients area of concern left breast. No complaints of pain or tenderness on exam.      Pelvic/Bimanual Pap is not indicated today per BCCCP guidelines. Patient scheduled for Pap smear at the free cervical cancer screening on Wednesday, June 21, 2021 at 1045.   Smoking History: Patient has never smoked.   Patient Navigation: Patient education provided. Access to services provided for patient through [place] program. Spanish interpreter [name] from [facility] provided.   Colorectal Cancer Screening: Per patient has never had colonoscopy completed. FIT Test given to patient to complete. No complaints today.    Breast and Cervical Cancer Risk Assessment: Patient does not have family history of breast cancer, known genetic mutations, or  radiation treatment to the chest before age 67. Patient does not have history of cervical dysplasia, immunocompromised, or DES exposure in-utero.  Risk Assessment     Risk Scores       04/25/2021   Last edited by: Meryl Dare, CMA   5-year risk: 0.8 %   Lifetime risk: 7.8 %            A: BCCCP exam without pap smear Complaint of two left breast lumps and bilateral breast pain.  P: Referred patient to [facility] for a right breast stereotactic  biopsy per recommendation. Appointment scheduled Wednesday, April 26, 2021 at 0915.  Priscille Heidelberg, RN 04/25/2021 2:05 PM

## 2021-04-25 NOTE — Patient Instructions (Addendum)
Explained breast self awareness with Ronnetta Wessler. Patient did not need a Pap smear today due to last Pap smear was 05/14/2018 per patient. Let her know BCCCP will cover Pap smears every 3 years unless has a history of abnormal Pap smears. Patient scheduled for Pap smear at the free cervical cancer screening on Wednesday, June 21, 2021 at 1045. Referred patient to [facility] for a right breast stereotactic  biopsy per recommendation. Appointment scheduled Wednesday, April 26, 2021 at 0915. Patient aware of appointments and will be there. Haevyn Wendell verbalized understanding.  Ayyub Krall, Kathaleen Maser, RN 2:05 PM

## 2021-04-26 ENCOUNTER — Other Ambulatory Visit: Payer: Self-pay | Admitting: Radiology

## 2021-05-02 ENCOUNTER — Other Ambulatory Visit: Payer: Self-pay | Admitting: *Deleted

## 2021-05-02 MED ORDER — ATORVASTATIN CALCIUM 80 MG PO TABS: 80.0000 mg | ORAL_TABLET | Freq: Every day | ORAL | 3 refills | Status: DC

## 2021-05-22 ENCOUNTER — Ambulatory Visit: Payer: Self-pay

## 2021-06-21 ENCOUNTER — Ambulatory Visit: Payer: Self-pay

## 2021-06-21 DIAGNOSIS — Z124 Encounter for screening for malignant neoplasm of cervix: Secondary | ICD-10-CM

## 2021-08-22 ENCOUNTER — Ambulatory Visit (INDEPENDENT_AMBULATORY_CARE_PROVIDER_SITE_OTHER): Payer: Self-pay | Admitting: Pulmonary Disease

## 2021-08-22 ENCOUNTER — Encounter: Payer: Self-pay | Admitting: Pulmonary Disease

## 2021-08-22 VITALS — BP 115/62 | HR 73 | Temp 97.8°F | Ht <= 58 in | Wt 242.4 lb

## 2021-08-22 DIAGNOSIS — G4733 Obstructive sleep apnea (adult) (pediatric): Secondary | ICD-10-CM

## 2021-08-22 DIAGNOSIS — I5030 Unspecified diastolic (congestive) heart failure: Secondary | ICD-10-CM

## 2021-08-22 NOTE — Patient Instructions (Addendum)
We will message DME company about CPAP care instructions and adequate replacement supplies.  ? ?Continue nightly use of CPAP ? ?Talk with your primary care team about switching off lisinopril for the cough. If you continue to have issues with cough after stopping lisinopril for 3-4 weeks, then let us know. ? ?Recommend increasing your physical activity little by little for weigh reduction. Consider working out on a stationary bike.  ? ?Follow up in 6 months. ?

## 2021-08-22 NOTE — Progress Notes (Signed)
? ?Synopsis: Referred in September 2022 for shortness of breath by Lowella Bandy ? ?Subjective:  ? ?PATIENT ID: Mandy Garza GENDER: female DOB: 03-23-1971, MRN: [id number] ? ?HPI ? ?Chief Complaint  ?Patient presents with  ? Follow-up  ?  Still SOB. Coughing attacks that are scary. No wheezing. Wearing CPAP and reeducated about filter changes. Using O2 until new filters come in.  ? ?Mandy Garza is a 51 year old woman, never smoker with obesity, hypertension and type II diabetes who returns to pulmonary clinic for obstructive sleep apnea.  ? ?Patient was diagnosed with severe obstructive sleep apnea and underwent CPAP titration study with CPAP at 11cmH2O with 1L of supplemental O2.  ? ?She has done well with CPAP therapy until this past week with increased coughing fits throughout the day and night. They are concerned about the hygiene of the CPAP machine as they have not been exchanging filters and she has noticed rust in the base of the humidification chamber.  ? ?The cough is dry and feels like it is coming from her throat. She has been on lisinopril for over a year now. Denies issues with reflux, sinus congestion with post-nasal drainage or seasonal allergies.  ? ?She continues to have exertional dyspnea after 10-12 steps and feels very limited in what physical activity she is able to do.  ? ?OV 02/01/21 ?She reports starting supplemental oxygen at the beginning of the year. She is using oxygen at night and with exertion. She is limited with her physical activity due to her dyspnea. She denies cough, sputum production or wheezing. She denies orthopnea. She does report bilateral lower extremity edema over the past 2-3 months. She does report some nighttime awakenings with shortness of breath. She complains of daytime sleepiness and does not feel rested after sleeping. She does snore occasionally. She reports her weight has been stable over the past 10 years. She complains of posterior lung pain that  is sporadic and is not able to specify whether there is an aggravating etiology. ? ?She denies history of smoking or second hand smoke. She is originally from [place] and has lived in the [place] since she was 51 years old. No exposure to charcoal stoves when growing up.  ? ?She was recently admitted to [facility] 12/15/20 for septic shock due to left pyelonephritis and E. Coli bacteremia. She was also treated for covid with remdesivir. CTA Chest from her admission did not reveal pulmonary emboli but did show bilateral pulmonary infiltrates or pulmonary edema and cardiomegaly.  ? ?Past Medical History:  ?Diagnosis Date  ? Headache   ? "weekly" (09/29/2015)  ? High cholesterol   ? Hypertension   ? Obesity   ? Pneumonia ~ 2008  ? Type II diabetes mellitus (HCC)   ?  ? ?Family History  ?Problem Relation Age of Onset  ? Diabetes Father   ? Hypertension Father   ?  ? ?Social History  ? ?Socioeconomic History  ? Marital status: Divorced  ?  Spouse name: Not on file  ? Number of children: Not on file  ? Years of education: Not on file  ? Highest education level: Not on file  ?Occupational History  ? Not on file  ?Tobacco Use  ? Smoking status: Never  ? Smokeless tobacco: Never  ?Vaping Use  ? Vaping Use: Never used  ?Substance and Sexual Activity  ? Alcohol use: Not Currently  ? Drug use: No  ? Sexual activity: Yes  ?  Birth control/protection: None  ?Other  Topics Concern  ? Not on file  ?Social History Narrative  ? ** Merged History Encounter **  ?    ? ?Social Determinants of Health  ? ?Financial Resource Strain: Not on file  ?Food Insecurity: No Food Insecurity  ? Worried About Programme researcher, broadcasting/film/video in the Last Year: Never true  ? Ran Out of Food in the Last Year: Never true  ?Transportation Needs: No Transportation Needs  ? Lack of Transportation (Medical): No  ? Lack of Transportation (Non-Medical): No  ?Physical Activity: Not on file  ?Stress: Not on file  ?Social Connections: Not on file  ?Intimate Partner Violence: Not on file   ?  ? ?No Known Allergies  ? ?Outpatient Medications Prior to Visit  ?Medication Sig Dispense Refill  ? atorvastatin (LIPITOR) 80 MG tablet Take 1 tablet (80 mg total) by mouth at bedtime. 90 tablet 3  ? dapagliflozin propanediol (FARXIGA) 10 MG TABS tablet Take 1 tablet (10 mg total) by mouth daily before breakfast. 30 tablet 11  ? furosemide (LASIX) 40 MG tablet Take 1 tablet (40 mg total) by mouth daily. 30 tablet 11  ? gabapentin (NEURONTIN) 300 MG capsule gabapentin 300 mg capsule ? TAKE 1 CAPSULE BY MOUTH EVERY DAY IN THE EVENING    ? glipiZIDE (GLUCOTROL) 10 MG tablet Take 10 mg by mouth daily before breakfast.    ? lisinopril (ZESTRIL) 10 MG tablet Take 10 mg by mouth daily.    ? nitrofurantoin, macrocrystal-monohydrate, (MACROBID) 100 MG capsule Take one cap PO Q12hr with food. 14 capsule 0  ? NOVOLIN 70/30 RELION (70-30) 100 UNIT/ML injection SMARTSIG:20 Unit(s) SUB-Q Twice Daily    ? ?No facility-administered medications prior to visit.  ? ?Review of Systems  ?Constitutional:  Negative for chills, fever, malaise/fatigue and weight loss.  ?HENT:  Negative for congestion, sinus pain and sore throat.   ?Eyes: Negative.   ?Respiratory:  Positive for cough and shortness of breath. Negative for hemoptysis, sputum production and wheezing.   ?Cardiovascular:  Negative for chest pain, palpitations, orthopnea, claudication and leg swelling.  ?Gastrointestinal:  Negative for abdominal pain, heartburn, nausea and vomiting.  ?Genitourinary: Negative.   ?Musculoskeletal:  Negative for joint pain and myalgias.  ?Skin:  Negative for rash.  ?Neurological:  Negative for weakness.  ?Endo/Heme/Allergies: Negative.   ?Psychiatric/Behavioral: Negative.    ? ?Objective:  ? ?Vitals:  ? 08/22/21 0917  ?BP: 115/62  ?Pulse: 73  ?Temp: 97.8 ?F (36.6 ?C)  ?TempSrc: Oral  ?SpO2: 97%  ?Weight: 242 lb 6.4 oz (110 kg)  ?Height: 4\' 8"  (1.422 m)  ? ?Physical Exam ?Constitutional:   ?   General: She is not in acute distress. ?   Appearance:  She is obese. She is not ill-appearing.  ?HENT:  ?   Head: Normocephalic and atraumatic.  ?Eyes:  ?   General: No scleral icterus. ?   Conjunctiva/sclera: Conjunctivae normal.  ?Cardiovascular:  ?   Rate and Rhythm: Normal rate and regular rhythm.  ?   Pulses: Normal pulses.  ?   Heart sounds: Normal heart sounds. No murmur heard. ?Pulmonary:  ?   Effort: Pulmonary effort is normal.  ?   Breath sounds: Normal breath sounds. No wheezing, rhonchi or rales.  ?Musculoskeletal:  ?   Right lower leg: No edema.  ?   Left lower leg: No edema.  ?Skin: ?   General: Skin is warm and dry.  ?Neurological:  ?   General: No focal deficit present.  ?  Mental Status: She is alert.  ?Psychiatric:     ?   Mood and Affect: Mood normal.     ?   Behavior: Behavior normal.     ?   Thought Content: Thought content normal.     ?   Judgment: Judgment normal.  ? ? ?CBC ?   ?Component Value Date/Time  ? WBC 8.9 01/06/2016 0454  ? RBC 4.37 01/06/2016 0454  ? HGB 12.8 01/06/2016 0454  ? HCT 38.8 01/06/2016 0454  ? PLT 160 01/06/2016 0454  ? MCV 88.8 01/06/2016 0454  ? MCH 29.3 01/06/2016 0454  ? MCHC 33.0 01/06/2016 0454  ? RDW 13.1 01/06/2016 0454  ? LYMPHSABS 2.1 09/29/2015 1051  ? MONOABS 0.8 09/29/2015 1051  ? EOSABS 0.0 09/29/2015 1051  ? BASOSABS 0.0 09/29/2015 1051  ? ? ?  Latest Ref Rng & Units 04/19/2021  ? 10:57 AM 01/06/2016  ?  4:54 AM 01/05/2016  ?  5:35 PM  ?BMP  ?Glucose 70 - 99 mg/dL 161241   096308   045315    ?BUN 6 - 24 mg/dL 18   12   8     ?Creatinine 0.57 - 1.00 mg/dL 4.090.79   8.110.77   9.140.81    ?BUN/Creat Ratio 9 - 23 23      ?Sodium 134 - 144 mmol/L 138   134   129    ?Potassium 3.5 - 5.2 mmol/L 5.0   3.9   4.2    ?Chloride 96 - 106 mmol/L 99   100   97    ?CO2 20 - 29 mmol/L 24   23   23     ?Calcium 8.7 - 10.2 mg/dL 8.8   8.0   8.9    ? ?Chest imaging: ?CTA Chest 12/16/20 ?No pulmonary embolus. No adenopathy, no pleural or pericardial effusion. Cardiomegaly present. Bilateral infiltrate or edema.  ? ?CXR 09/29/2015 ?Cardiac shadow is mildly  enlarged in size. The lungs are well ?aerated with minimal right basilar atelectasis. No focal confluent ?infiltrate is seen. No bony abnormality is noted. ? ?PFT: ?   ? View : No data to display.  ?

## 2021-09-01 ENCOUNTER — Encounter: Payer: Self-pay | Admitting: Cardiovascular Disease

## 2021-09-01 ENCOUNTER — Ambulatory Visit (INDEPENDENT_AMBULATORY_CARE_PROVIDER_SITE_OTHER): Payer: Self-pay | Admitting: Cardiovascular Disease

## 2021-09-01 VITALS — BP 118/76 | HR 76 | Ht <= 58 in | Wt 241.6 lb

## 2021-09-01 DIAGNOSIS — E669 Obesity, unspecified: Secondary | ICD-10-CM

## 2021-09-01 DIAGNOSIS — I10 Essential (primary) hypertension: Secondary | ICD-10-CM

## 2021-09-01 DIAGNOSIS — E7800 Pure hypercholesterolemia, unspecified: Secondary | ICD-10-CM

## 2021-09-01 DIAGNOSIS — I5032 Chronic diastolic (congestive) heart failure: Secondary | ICD-10-CM

## 2021-09-01 DIAGNOSIS — G4733 Obstructive sleep apnea (adult) (pediatric): Secondary | ICD-10-CM

## 2021-09-01 DIAGNOSIS — E1165 Type 2 diabetes mellitus with hyperglycemia: Secondary | ICD-10-CM

## 2021-09-01 MED ORDER — FUROSEMIDE 20 MG PO TABS
20.0000 mg | ORAL_TABLET | Freq: Every day | ORAL | 3 refills | Status: AC
Start: 2021-09-01 — End: ?

## 2021-09-01 NOTE — Progress Notes (Addendum)
?Cardiology Office Note:   ? ?Date:  09/03/2021  ? ?IDNoora Garza, DOB [birth year], MRN [id number] ? ?PCP:  Cathleen Corti, PA-C ?  ?[facility] Providers ?Cardiologist:  Sanda Klein, MD    ? ?Referring MD: Cathleen Corti, PA-C  ? ?Chief Complaint  ?Patient presents with  ? Congestive Heart Failure  ?   ?  ?Mandy Garza is a 51 y.o. female who is being seen today for the evaluation of CHF at the request of Brantley Stage A, PA-C. ? ? ?History of Present Illness:   ? ?Mandy Garza is a 51 y.o. female with super morbid obesity, hypertension, type 2 diabetes mellitus, and a hx of congestive heart failure with preserved left ventricular systolic function that became apparent during the hospitalization for acute respiratory failure during COVID-19 infection in January 2022, when she was admitted to [place] with another episode of decompensation during E. coli sepsis in August 2022 at [facility].   ? ?She was then hospitalized at [facility] in August 2022 with E. coli bacteremia and septic shock.  CT angiography during that admission did not show evidence of pulmonary embolism but showed cardiomegaly and pulmonary infiltrates suggestive of pulmonary edema.  She was seen in follow-up by Dr. Erin Fulling in the pulmonary clinic on 02/01/2021 for shortness of breath.  He ordered an echocardiogram that showed normal left ventricular systolic function (EF 66%) normal regional wall motion, mild LVH, grade 2 diastolic dysfunction (E/e' >15), no significant valvular abnormalities. ? ?She is doing better.  She feels she is breathing substantially better.  NYHA functional class II, able to participate in personal and house care.  Has not had leg edema, orthopnea or PND.  Denies palpitations dizziness or syncope and never has complaints of chest pain. ?She denies focal neurological complaints other than peripheral neuropathy in the feet.   ? ?Remains super obese with a BMI over 54.   Glycemic control has improved, but her most recent hemoglobin A1c from last December was 10.7%.  On highly active statin, LDL in December was 81,  at target less than 100.  She has normal renal function.  She does not smoke. ? ?She is from [place].  She does not have insurance.  She is accompanied by her daughter today, who until recently worked for [employer]. ? ?Past Medical History:  ?Diagnosis Date  ? Headache   ? "weekly" (09/29/2015)  ? High cholesterol   ? Hypertension   ? Obesity   ? Pneumonia ~ 2008  ? Type II diabetes mellitus ([place])   ? ? ?Past Surgical History:  ?Procedure Laterality Date  ? ABDOMINAL HYSTERECTOMY  03/2002  ? [place]; 2003  ? ? ?Current Medications: ?Current Meds  ?Medication Sig  ? atorvastatin (LIPITOR) 80 MG tablet Take 1 tablet (80 mg total) by mouth at bedtime.  ? dapagliflozin propanediol (FARXIGA) 10 MG TABS tablet Take 1 tablet (10 mg total) by mouth daily before breakfast.  ? gabapentin (NEURONTIN) 300 MG capsule gabapentin 300 mg capsule ? TAKE 1 CAPSULE BY MOUTH EVERY DAY IN THE EVENING  ? glipiZIDE (GLUCOTROL) 10 MG tablet Take 10 mg by mouth daily before breakfast.  ? lisinopril (ZESTRIL) 10 MG tablet Take 10 mg by mouth daily.  ? nitrofurantoin, macrocrystal-monohydrate, (MACROBID) 100 MG capsule Take one cap PO Q12hr with food.  ? NOVOLIN 70/30 RELION (70-30) 100 UNIT/ML injection SMARTSIG:20 Unit(s) SUB-Q Twice Daily  ? [DISCONTINUED] furosemide (LASIX) 40 MG tablet Take  1 tablet (40 mg total) by mouth daily.  ?  ? ?Allergies:   Patient has no known allergies.  ? ?Social History  ? ?Socioeconomic History  ? Marital status: Divorced  ?  Spouse name: Not on file  ? Number of children: Not on file  ? Years of education: Not on file  ? Highest education level: Not on file  ?Occupational History  ? Not on file  ?Tobacco Use  ? Smoking status: Never  ? Smokeless tobacco: Never  ?Vaping Use  ? Vaping Use: Never used  ?Substance and Sexual Activity  ?  Alcohol use: Not Currently  ? Drug use: No  ? Sexual activity: Yes  ?  Birth control/protection: None  ?Other Topics Concern  ? Not on file  ?Social History Narrative  ? ** Merged History Encounter **  ?    ? ?Social Determinants of Health  ? ?Financial Resource Strain: Not on file  ?Food Insecurity: No Food Insecurity  ? Worried About Charity fundraiser in the Last Year: Never true  ? Ran Out of Food in the Last Year: Never true  ?Transportation Needs: No Transportation Needs  ? Lack of Transportation (Medical): No  ? Lack of Transportation (Non-Medical): No  ?Physical Activity: Not on file  ?Stress: Not on file  ?Social Connections: Not on file  ?  ? ?Family History: ?The patient's family history includes Diabetes in her father; Hypertension in her father. ? ?ROS:   ?Please see the history of present illness.    ? All other systems reviewed and are negative. ? ?EKGs/Labs/Other Studies Reviewed:   ? ?The following studies were reviewed today: ?Echocardiogram 02/09/2021 ? ?1. Left ventricular ejection fraction, by estimation, is 60 to 65%. Left  ?ventricular ejection fraction by 3D volume is 61 %. The left ventricle has  ?normal function. The left ventricle has no regional wall motion  ?abnormalities. There is mild concentric  ?left ventricular hypertrophy. Left ventricular diastolic parameters are  ?consistent with Grade II diastolic dysfunction (pseudonormalization).  ? 2. Right ventricular systolic function is normal. The right ventricular  ?size is normal. There is normal pulmonary artery systolic pressure.  ? 3. The mitral valve is normal in structure. No evidence of mitral valve  ?regurgitation. No evidence of mitral stenosis.  ? 4. The aortic valve has an indeterminant number of cusps. Aortic valve  ?regurgitation is not visualized. Mild aortic valve sclerosis is present,  ?with no evidence of aortic valve stenosis.  ? 5. The inferior vena cava is normal in size with greater than 50%  ?respiratory  variability, suggesting right atrial pressure of 3 mmHg.  ? ?Comparison(s): No prior Echocardiogram.  ? ?CTA chest (PE protocol), 12/16/2020 [facility]  ?IMPRESSION:  ?1. No pulmonary embolus.  ?2. Cardiomegaly.  ?3. Bilateral infiltrate or edema   ? ?Note that CT of the abdomen pelvis during the same admission did not mention any aortic or other arterial atherosclerotic changes either. ? ? ?CTA chest (PE protocol), [facility]/20/2020 [facility] ?1.  No evidence of acute thromboembolic disease within the evaluated pulmonary arteries. Respiratory motion somewhat limits evaluation.  ?2.  Heavy burden of airspace disease with confluent groundglass opacification with more consolidative components within the posterior aspect of the bilateral upper and lower lobes suggestive of Covid 19 infection. Superimposed bacterial pneumonia not excluded.  ?3.  Indeterminate right adrenal gland nodule. Consider nonemergent outpatient imaging with adrenal protocol CT for further characterization if clinically indicated. ? ? ? ?EKG:  EKG is ordered today.  The EKG ordered today shows normal sinus rhythm is a completely normal tracing.  QTc 450 ms ?Recent Labs: ?04/19/2021: ALT 31; BNP 35.7; BUN 18; Creatinine, Ser 0.79; Potassium 5.0; Sodium 138  ?Recent Lipid Panel ?   ?Component Value Date/Time  ? CHOL 169 04/19/2021 1057  ? TRIG 271 (H) 04/19/2021 1057  ? HDL 44 04/19/2021 1057  ? CHOLHDL 3.8 04/19/2021 1057  ? CHOLHDL 6.0 (H) 03/10/2015 0932  ? VLDL 59 (H) 03/10/2015 0932  ? [place] 81 04/19/2021 1057  ? ?Labs 08/12-17/2022 ?Hemoglobin A1c 12.7% (was 15.4% in January 2022) ?Potassium 3.9, creatinine 0.66, alk phos 213, normal transaminases and bilirubin, TSH 1.84, no proteinuria on UA ?Hemoglobin 10.1, platelet count 129K ? ?Lipid profile 05/21/2020 ?Cholesterol 117, triglycerides 151, HDL 32, LDL 63 ? ?04/19/2021 ?Cholesterol 169, HDL 44, LDL 81, triglycerides 271 ?Hemoglobin A1c 10.7%, creatinine 0.79, potassium 5.0,  ALT 31, BNP 35 ? ?Risk Assessment/Calculations:   ?  ? ?    ? ?Physical Exam:   ? ?VS:  BP 118/76 (BP Location: Left Arm, Patient Position: Sitting, Cuff Size: Large)   Pulse 76   Ht _0  (1.422 m)   Wt 241 lb 9.6 oz

## 2021-09-01 NOTE — Patient Instructions (Signed)
Medication Instructions:  ? ?DECREASE FUROSEMIDE TO 20 MG ONCE DAILY=1/2 OF THE 40 MG TABLET ONCE DAILY ? ?*If you need a refill on your cardiac medications before your next appointment, please call your pharmacy* ? ? ?Follow-Up: ?At [facility], you and your health needs are our priority.  As part of our continuing mission to provide you with exceptional heart care, we have created designated Provider Care Teams.  These Care Teams include your primary Cardiologist (physician) and Advanced Practice Providers (APPs -  Physician Assistants and Nurse Practitioners) who all work together to provide you with the care you need, when you need it. ? ?We recommend signing up for the patient portal called "MyChart".  Sign up information is provided on this After Visit Summary.  MyChart is used to connect with patients for Virtual Visits (Telemedicine).  Patients are able to view lab/test results, encounter notes, upcoming appointments, etc.  Non-urgent messages can be sent to your provider as well.   ?To learn more about what you can do with MyChart, go to [email or web address].   ? ?Your next appointment:   ?6 month(s) ? ?The format for your next appointment:   ?In Person ? ?Provider:   ?Sanda Klein, MD   ? ? ? ? ?Important Information About Sugar ? ? ? ? ?  ?

## 2021-09-03 ENCOUNTER — Encounter: Payer: Self-pay | Admitting: Cardiovascular Disease

## 2021-09-04 ENCOUNTER — Telehealth: Payer: Self-pay | Admitting: *Deleted

## 2021-09-04 NOTE — Telephone Encounter (Signed)
Attempted to reach the patient. No voicemail was available.  

## 2021-09-04 NOTE — Telephone Encounter (Signed)
-----   Message from Thurmon Fair, MD sent at 09/03/2021  3:59 PM EDT ----- ?Read a note from her pulmonary doctor that she was having problems with a cough and he thought it could be the ACE inhibitor. ?Please have her stop lisinopril and start losartan 25 mg once daily. ? ?

## 2021-09-11 NOTE — Telephone Encounter (Signed)
Left a message with a family member to call back ?

## 2021-09-22 ENCOUNTER — Encounter: Payer: Self-pay | Admitting: *Deleted

## 2021-09-22 NOTE — Telephone Encounter (Signed)
Attempted times three to reach the patient unsuccessfully. MyChart message has been sent.

## 2022-05-22 ENCOUNTER — Ambulatory Visit: Payer: Self-pay | Admitting: *Deleted

## 2022-05-22 VITALS — BP 120/87 | Wt 218.0 lb

## 2022-05-22 DIAGNOSIS — Z01419 Encounter for gynecological examination (general) (routine) without abnormal findings: Secondary | ICD-10-CM

## 2022-05-22 DIAGNOSIS — Z1211 Encounter for screening for malignant neoplasm of colon: Secondary | ICD-10-CM

## 2022-05-22 NOTE — Patient Instructions (Signed)
Explained breast self awareness with Elisabeth Riede. Pap smear completed today. Let her know [facility]CP will cover Pap smears and HPV Typing every 5 years unless has a history of abnormal Pap smears. Referred patient to [facility] for a screening mammogram per recommendation from previous imaging. Appointment scheduled Tuesday, May 22, 2022 at 1200. Patient aware of appointment and will be there. Let patient know [facility] will follow up with her within the next couple weeks with results of mammogram by letter or phone. Berdell Arista verbalized understanding.  Janei Scheff, Arvil Chaco, RN 10:55 AM

## 2022-05-22 NOTE — Progress Notes (Signed)
[name] is a 52 y.o. female who presents to [facility] clinic today with complaint of left outer breast pain since prior to previous exam 04/25/2021. Patient states the pain comes and goes. Patient rates at a 5 out of 10.    Pap Smear: Pap smear completed today. Last Pap smear was 05/14/2018 at [facility] clinic and was normal per patient. Per patient has no history of an abnormal Pap smear. Patient has a history of a supracervical hysterectomy in November 2003 due to menorrhagia. Last Pap smear result is not available in Epic.    Physical exam: Breasts Breasts symmetrical. No skin abnormalities left breast. Two healed sores right breast at 12 o'clock above areola. No nipple retraction left breast. Slight nipple inversion observed right breast that per patient is normal for her. No nipple discharge bilateral breasts. No lymphadenopathy. No lumps palpated bilateral breasts. No lumps palpated bilateral breasts. No complaints of pain or tenderness on exam.    Pelvic/Bimanual Ext Genitalia No lesions, no swelling and no discharge observed on external genitalia.        Vagina Vagina pink and normal texture. No lesions or discharge observed in vagina.        Cervix Cervix is present. Cervix pink and of normal texture. Cervix friable. No discharge observed.    Uterus Uterus is absent due to history of supracervical hysterectomy.   Adnexae Bilateral ovaries present and unable to palpate. No tenderness on palpation.         Rectovaginal No rectal exam completed today since patient had no rectal complaints. No skin abnormalities observed on exam.     Smoking History: Patient has never smoked.    Patient Navigation: Patient education provided. Access to services provided for patient through [facility] program.   Colorectal Cancer Screening: Per patient has never had colonoscopy completed. FIT Test given to patient to complete. No complaints today.    Breast and Cervical  Cancer Risk Assessment: Patient does not have family history of breast cancer, known genetic mutations, or radiation treatment to the chest before age 68. Patient does not have history of cervical dysplasia, immunocompromised, or DES exposure in-utero.  Risk Scores as of 05/22/2022     Mandy Garza           5-year 1.18 %   Lifetime 10.82 %   This patient is [place] but has no documented birth country, so the [employer] used data from [place] patients to calculate their risk score. Document a birth country in the Demographics activity for a more accurate score.         Last calculated by Claretha Cooper, CMA on 05/22/2022 at 10:50 AM        A: BCCCP exam with pap smear Complaint of left breast pain.  P: Referred patient to [facility] for a screening mammogram per recommendation from previous imaging. Appointment scheduled Tuesday, May 22, 2022 at 1200.  Loletta Parish, RN 05/22/2022 10:55 AM

## 2022-05-24 ENCOUNTER — Telehealth: Payer: Self-pay

## 2022-05-24 LAB — CYTOLOGY - PAP
Comment: NEGATIVE
Diagnosis: NEGATIVE
High risk HPV: NEGATIVE

## 2022-05-24 NOTE — Telephone Encounter (Signed)
I spoke with pt directly and advised of her PAP/HPV results. Pt was advised to repeat PAP in 5 years.

## 2023-01-26 LAB — LAB REPORT - SCANNED: A1c: 15

## 2023-02-28 NOTE — Progress Notes (Deleted)
  Cardiology Office Note:  .   Date:  02/28/2023  ID:  [name], DOB 20-May-1970, MRN [id number] PCP: Marya Landry  [facility] Providers Cardiologist:  Thurmon Fair, MD { }   History of Present Illness: .   Mandy Garza is a 52 y.o. female super morbid obesity, hypertension, type 2 diabetes mellitus, and a hx of congestive heart failure with preserved left ventricular systolic function   ROS: ***  Studies Reviewed: .        *** EKG Interpretation Date/Time:    Ventricular Rate:    PR Interval:    QRS Duration:    QT Interval:    QTC Calculation:   R Axis:      Text Interpretation:     Physical Exam:   VS:  There were no vitals taken for this visit.   Wt Readings from Last 3 Encounters:  05/22/22 218 lb (98.9 kg)  09/01/21 241 lb 9.6 oz (109.6 kg)  08/22/21 242 lb 6.4 oz (110 kg)    GEN: Well nourished, well developed in no acute distress NECK: No JVD; No carotid bruits CARDIAC: ***RRR, no murmurs, rubs, gallops RESPIRATORY:  Clear to auscultation without rales, wheezing or rhonchi  ABDOMEN: Soft, non-tender, non-distended EXTREMITIES:  No edema; No deformity   ASSESSMENT AND PLAN: .   ***    {Are you ordering a CV Procedure (e.g. stress test, cath, DCCV, TEE, etc)?   Press F2        :[id number]}  Dispo: ***  Signed, Bettey Mare. Liborio Nixon, ANP, AACC

## 2023-03-01 ENCOUNTER — Ambulatory Visit: Payer: Self-pay | Admitting: Adult Health

## 2023-03-13 ENCOUNTER — Ambulatory Visit: Payer: Self-pay | Attending: Adult Health | Admitting: Cardiovascular Disease

## 2023-03-13 ENCOUNTER — Encounter: Payer: Self-pay | Admitting: Cardiovascular Disease

## 2023-03-13 ENCOUNTER — Telehealth: Payer: Self-pay | Admitting: Emergency Medicine

## 2023-03-13 VITALS — BP 128/80 | HR 68 | Ht <= 58 in | Wt 246.4 lb

## 2023-03-13 DIAGNOSIS — I10 Essential (primary) hypertension: Secondary | ICD-10-CM

## 2023-03-13 DIAGNOSIS — Z794 Long term (current) use of insulin: Secondary | ICD-10-CM

## 2023-03-13 DIAGNOSIS — E119 Type 2 diabetes mellitus without complications: Secondary | ICD-10-CM

## 2023-03-13 DIAGNOSIS — E7800 Pure hypercholesterolemia, unspecified: Secondary | ICD-10-CM

## 2023-03-13 DIAGNOSIS — I5032 Chronic diastolic (congestive) heart failure: Secondary | ICD-10-CM

## 2023-03-13 DIAGNOSIS — G4733 Obstructive sleep apnea (adult) (pediatric): Secondary | ICD-10-CM

## 2023-03-13 DIAGNOSIS — E669 Obesity, unspecified: Secondary | ICD-10-CM

## 2023-03-13 MED ORDER — IRBESARTAN 150 MG PO TABS: 150.0000 mg | ORAL_TABLET | Freq: Every day | ORAL | 3 refills | Status: AC

## 2023-03-13 NOTE — Patient Instructions (Signed)
Medication Instructions:  STOP LISINOPRIL  START IRBESARTAN 150 MG DAILY  *If you need a refill on your cardiac medications before your next appointment, please call your pharmacy*  Follow-Up: At [facility], you and your health needs are our priority.  As part of our continuing mission to provide you with exceptional heart care, we have created designated Provider Care Teams.  These Care Teams include your primary Cardiologist (physician) and Advanced Practice Providers (APPs -  Physician Assistants and Nurse Practitioners) who all work together to provide you with the care you need, when you need it.  We recommend signing up for the patient portal called "MyChart".  Sign up information is provided on this After Visit Summary.  MyChart is used to connect with patients for Virtual Visits (Telemedicine).  Patients are able to view lab/test results, encounter notes, upcoming appointments, etc.  Non-urgent messages can be sent to your provider as well.   To learn more about what you can do with MyChart, go to [email or web address].    Your next appointment:   1 year(s)  Provider:   Thurmon Fair, MD

## 2023-03-13 NOTE — Progress Notes (Unsigned)
Cardiology Office Note:    Date:  03/13/2023   ID:  Mandy Garza, DOB February 04, 1971, MRN [id number]  PCP:  Sandre Kitty, PA-C   [facility] Providers Cardiologist:  Thurmon Fair, MD     Referring MD: Sandre Kitty, PA-C   Chief Complaint  Patient presents with   Congestive Heart Failure     History of Present Illness:    Mandy Garza is a 52 y.o. female with super morbid obesity, hypertension, type 2 diabetes mellitus, and a hx of congestive heart failure with preserved left ventricular systolic function that became apparent during the hospitalization for acute respiratory failure during COVID-19 infection in January 2022, when she was admitted to [facility] with another episode of decompensation during E. coli sepsis in August 2022 at [facility].    She continues to have NYHA functional class II shortness of breath.  She usually has to stop after about 20 steps.  She has gained more weight, especially after starting treatment with Lantus on top of her previous prescription for 70/30 insulin.  Her BMI is now 55.  She has not had problems with edema, orthopnea or PND.  She uses CPAP at night and denies daytime hypersomnolence.  She sometimes has to remove the CPAP in the middle of night because she feels that she is smothering, but this is not a common occurrence.  She does not have to sit up to breathe better when this happens.  She has not had chest pain, palpitations, dizziness, syncope or focal neurological complaints other than chronic bilateral pedal neuropathy.    She tells me that her glycemic control remains or and that is why Lantus was recently added.  She is also taking [place] (she gets this from [place] where it is 8 times cheaper) glipizide and insulin 70/30.  She is also on atorvastatin and was told that her most recent lipid profile was good.  I do not have the actual results.  She has never smoked.  She is from [place], [place].   She is [place] fluently.  With she does not have insurance.  Her daughter recently worked for [employer].   She was then hospitalized at [facility] in August 2022 with E. coli bacteremia and septic shock.  CT angiography during that admission did not show evidence of pulmonary embolism but showed cardiomegaly and pulmonary infiltrates suggestive of pulmonary edema.  She was seen in follow-up by Dr. Francine Graven in the pulmonary clinic on 02/01/2021 for shortness of breath.  He ordered an echocardiogram that showed normal left ventricular systolic function (EF 61%) normal regional wall motion, mild LVH, grade 2 diastolic dysfunction (E/e' >15), no significant valvular abnormalities..  Past Medical History:  Diagnosis Date   Headache    "weekly" (09/29/2015)   High cholesterol    Hypertension    Obesity    Pneumonia ~ 2008   Type II diabetes mellitus (HCC)     Past Surgical History:  Procedure Laterality Date   ABDOMINAL HYSTERECTOMY  03/2002   CESAREAN SECTION  1998; 2003    Current Medications: Current Meds  Medication Sig   irbesartan (AVAPRO) 150 MG tablet Take 1 tablet (150 mg total) by mouth daily.   LANTUS SOLOSTAR 100 UNIT/ML Solostar Pen Inject 10 Units into the skin daily.     Allergies:   Patient has no known allergies.   Social History   Socioeconomic History   Marital status: Divorced    Spouse name: Not on file  Number of children: 2   Years of education: Not on file   Highest education level: Not on file  Occupational History   Not on file  Tobacco Use   Smoking status: Never   Smokeless tobacco: Never  Vaping Use   Vaping status: Never Used  Substance and Sexual Activity   Alcohol use: Not Currently   Drug use: No   Sexual activity: Yes    Birth control/protection: None  Other Topics Concern   Not on file  Social History Narrative   ** Merged History Encounter **       Social Determinants of Health   Financial Resource Strain: Medium Risk (12/17/2020)    [facility], [facility]   Overall Financial Resource Strain (CARDIA)    Difficulty of Paying Living Expenses: Somewhat hard  Food Insecurity: No Food Insecurity (05/22/2022)   Hunger Vital Sign    Worried About Running Out of Food in the Last Year: Never true    Ran Out of Food in the Last Year: Never true  Transportation Needs: No Transportation Needs (05/22/2022)   PRAPARE - Administrator, Civil Service (Medical): No    Lack of Transportation (Non-Medical): No  Physical Activity: Not on file  Stress: No Stress Concern Present (12/17/2020)   Received from [facility], [facility] - Occupational Stress Questionnaire    Feeling of Stress : Only a little  Social Connections: Unknown (09/15/2021)   Received from [facility], [facility]   Social Network    Social Network: Not on file     Family History: The patient's family history includes Diabetes in her father; Hypertension in her father.  ROS:   Please see the history of present illness.     All other systems reviewed and are negative.  EKGs/Labs/Other Studies Reviewed:    The following studies were reviewed today: Echocardiogram 02/09/2021  1. Left ventricular ejection fraction, by estimation, is 60 to 65%. Left  ventricular ejection fraction by 3D volume is 61 %. The left ventricle has  normal function. The left ventricle has no regional wall motion  abnormalities. There is mild concentric  left ventricular hypertrophy. Left ventricular diastolic parameters are  consistent with Grade II diastolic dysfunction (pseudonormalization).   2. Right ventricular systolic function is normal. The right ventricular  size is normal. There is normal pulmonary artery systolic pressure.   3. The mitral valve is normal in structure. No evidence of mitral valve  regurgitation. No evidence of mitral stenosis.   4. The aortic valve has an indeterminant number  of cusps. Aortic valve  regurgitation is not visualized. Mild aortic valve sclerosis is present,  with no evidence of aortic valve stenosis.   5. The inferior vena cava is normal in size with greater than 50%  respiratory variability, suggesting right atrial pressure of 3 mmHg.   Comparison(s): No prior Echocardiogram.   CTA chest (PE protocol), 12/16/2020 [facility]  IMPRESSION:  1. No pulmonary embolus.  2. Cardiomegaly.  3. Bilateral infiltrate or edema    Note that CT of the abdomen pelvis during the same admission did not mention any aortic or other arterial atherosclerotic changes either.   CTA chest (PE protocol), 05/20/2020 [facility] 1.  No evidence of acute thromboembolic disease within the evaluated pulmonary arteries. Respiratory motion somewhat limits evaluation.  2.  Heavy burden of airspace disease with confluent groundglass opacification with more consolidative components within the  posterior aspect of the bilateral upper and lower lobes suggestive of Covid 19 infection. Superimposed bacterial pneumonia not excluded.  3.  Indeterminate right adrenal gland nodule. Consider nonemergent outpatient imaging with adrenal protocol CT for further characterization if clinically indicated.    EKG:  EKG is not ordered today.  The EKG ordered last year shows normal sinus rhythm and is a completely normal tracing.  QTc 450 ms Recent Labs: No results found for requested labs within last 365 days.  Recent Lipid Panel    Component Value Date/Time   CHOL 169 04/19/2021 1057   TRIG 271 (H) 04/19/2021 1057   HDL 44 04/19/2021 1057   CHOLHDL 3.8 04/19/2021 1057   CHOLHDL 6.0 (H) 03/10/2015 0932   VLDL 59 (H) 03/10/2015 0932   LDLCALC 81 04/19/2021 1057   Labs 08/12-17/2022 Hemoglobin A1c 12.7% (was 15.4% in January 2022) Potassium 3.9, creatinine 0.66, alk phos 213, normal transaminases and bilirubin, TSH 1.84, no proteinuria on UA Hemoglobin 10.1, platelet  count 129K  Lipid profile 05/21/2020 Cholesterol 117, triglycerides 151, HDL 32, LDL 63  04/19/2021 Cholesterol 169, HDL 44, LDL 81, triglycerides 271 Hemoglobin A1c 10.7%, creatinine 0.79, potassium 5.0, ALT 31, BNP 35  Risk Assessment/Calculations:           Physical Exam:    VS:  BP 128/80 (BP Location: Left Arm, Patient Position: Sitting, Cuff Size: Normal)   Pulse 68   Ht 4\' 8"  (1.422 m)   Wt 246 lb 6.4 oz (111.8 kg)   SpO2 93%   BMI 55.24 kg/m     Wt Readings from Last 3 Encounters:  03/13/23 246 lb 6.4 oz (111.8 kg)  05/22/22 218 lb (98.9 kg)  09/01/21 241 lb 9.6 oz (109.6 kg)      General: Alert, oriented x3, no distress, super obesity limits the physical exam Head: no evidence of trauma, PERRL, EOMI, no exophtalmos or lid lag, no myxedema, no xanthelasma; normal ears, nose and oropharynx Neck: normal jugular venous pulsations and no hepatojugular reflux; brisk carotid pulses without delay and no carotid bruits Chest: clear to auscultation, no signs of consolidation by percussion or palpation, normal fremitus, symmetrical and full respiratory excursions Cardiovascular: normal position and quality of the apical impulse, regular rhythm, normal first and second heart sounds, no murmurs, rubs or gallops Abdomen: no tenderness or distention, no masses by palpation, no abnormal pulsatility or arterial bruits, normal bowel sounds, no hepatosplenomegaly Extremities: no clubbing, cyanosis or edema; 2+ radial, ulnar and brachial pulses bilaterally; 2+ right femoral, posterior tibial and dorsalis pedis pulses; 2+ left femoral, posterior tibial and dorsalis pedis pulses; no subclavian or femoral bruits Neurological: grossly nonfocal Psych: Normal mood and affect   ASSESSMENT:    1. Chronic diastolic heart failure (HCC)   2. Essential hypertension   3. Pure hypercholesterolemia   4. Type 2 diabetes mellitus without complication, with long-term current use of insulin (HCC)    5. Super obese   6. OSA (obstructive sleep apnea)      PLAN:    In order of problems listed above:  CHF: Continues to have NYHA functional class II.  No overt evidence of hypervolemia on exam.  BNP in December 2022 was completely normal at 35 (caveats related to obesity).  She does not have symptoms of ischemic heart disease or ECG changes, did not have coronary atherosclerotic calcifications on numerous previous CT studies.   Although she does have multiple coronary risk factors (diabetes, low HDL, hypertension) she is also young and  has a very good LDL cholesterol level on statin.  It is most likely that she has nonischemic cardiomyopathy due to obesity, hypertension and poorly controlled diabetes mellitus, although coronary disease has not been formally investigated.  On Farxiga for both her diabetes and for heart failure.   HTN: Well-controlled.  Switch from lisinopril to irbesartan due to cough. HLP: Need to get her most recent lipid profile from PCP. DM: Related to superobesity.  Although A1c is improving, remains outside of target range at 10.7%.  Marcelline Deist is an excellent choice for management.  She would also greatly benefit from addition of a GLP-1 agonist (cost is a big limitation. Superobesity: Underlies many of her chronic problems and increases the likelihood of poor outcome in the future.  Weight loss is essential.  Recommend starting a gradual program of physical activity.  It will have to be limited in the beginning due to her deconditioning.  I would target exercise sessions of 5 minutes at a time for goal of 1 hour a week to begin with, increasing slowly, eventually reaching 3 hours a week over the next several months. OSA: Severe sleep apnea, now on CPAP with 1 L O2 supplement.  Denies daytime hypersomnolence.          Medication Adjustments/Labs and Tests Ordered: Current medicines are reviewed at length with the patient today.  Concerns regarding medicines are outlined  above.  No orders of the defined types were placed in this encounter.  Meds ordered this encounter  Medications   irbesartan (AVAPRO) 150 MG tablet    Sig: Take 1 tablet (150 mg total) by mouth daily.    Dispense:  90 tablet    Refill:  3    Patient Instructions  Medication Instructions:  STOP LISINOPRIL  START IRBESARTAN 150 MG DAILY  *If you need a refill on your cardiac medications before your next appointment, please call your pharmacy*  Follow-Up: At [facility], you and your health needs are our priority.  As part of our continuing mission to provide you with exceptional heart care, we have created designated Provider Care Teams.  These Care Teams include your primary Cardiologist (physician) and Advanced Practice Providers (APPs -  Physician Assistants and Nurse Practitioners) who all work together to provide you with the care you need, when you need it.  We recommend signing up for the patient portal called "MyChart".  Sign up information is provided on this After Visit Summary.  MyChart is used to connect with patients for Virtual Visits (Telemedicine).  Patients are able to view lab/test results, encounter notes, upcoming appointments, etc.  Non-urgent messages can be sent to your provider as well.   To learn more about what you can do with MyChart, go to [email or web address].    Your next appointment:   1 year(s)  Provider:   Thurmon Fair, MD       Signed, Thurmon Fair, MD  03/13/2023 3:16 PM    [facility]

## 2023-03-13 NOTE — Telephone Encounter (Signed)
Fax sent to pt's PCP requesting most recent A1C and Lipid panel

## 2023-03-14 ENCOUNTER — Telehealth: Payer: Self-pay | Admitting: Licensed Clinical Social Worker

## 2023-03-14 NOTE — Progress Notes (Signed)
Heart and Vascular Care Navigation  03/14/2023  Mandy Garza 1971/02/09 [id number]  Reason for Referral: uninsured, unemployed Patient is participating in a Managed Medicaid Plan: no, self pay only  Engaged with patient by telephone for initial visit for Heart and Vascular Care Coordination.                                                                                                   Assessment:    LCSW was able to reach pt this afternoon at [phone]. Introduced self, role, reason for call. Confirmed home address and pt continues to see Lilibott Consultorios for PCP. Pt speaks English, shares her daughters help her with paperwork- resides with two adult daughters. Has not worked since 2020 and her daughters are the ones currently covering expenses. No major concerns just general financial challenges with food/utilities etc. Pt drives herself to and from appts, no issues there. Pt daughter previously received SNAP but sounds like maybe it was cut as she had been out of work, now that she is working may be good for her to reapply. Pt interested in assistance with medications, medical bills, general assistance as well. Will send [employer], Passenger transport manager and [employer] bank information along with [employer] Stamp application support. Will also send NCMedAssist application and Farxiga PAP if she is unable to get this medication from [place] in the future. LCSW encouraged pt to call me with any concerns after the call that may arise.                                    HRT/VAS Care Coordination     Patients Home Cardiology Office [facility]   Outpatient Care Team Social Worker   Social Worker Name: Octavio Graves, [place], [phone]   Living arrangements for the past 2 months Single Family Home   Lives with: Adult Children   Patient Current Insurance Coverage Self-Pay   Patient Has Concern With Paying Medical Bills Yes   Patient  Concerns With Medical Bills uninsured, may not be eligible for Medicaid   Medical Bill Referrals: will send Medicaid flyer and [facility] Financial Assistance   Does Patient Have Prescription Coverage? No   Patient Prescription Assistance Programs [place] Medassist   Home Assistive Devices/Equipment CBG Meter   [facility]       Social History:                                                                             SDOH Screenings   Food Insecurity: Food Insecurity Present (03/14/2023)  Housing: Low Risk  (03/14/2023)  Transportation Needs: No Transportation Needs (03/14/2023)  Utilities: Not At Risk (03/14/2023)  Financial Resource  Strain: Medium Risk (03/14/2023)  Social Connections: Unknown (09/15/2021)   Received from [facility], [facility]  Stress: No Stress Concern Present (12/17/2020)   Received from [facility], [facility]  Tobacco Use: Low Risk  (03/13/2023)  Health Literacy: Adequate Health Literacy (03/14/2023)    SDOH Interventions: Financial Resources:  Financial Strain Interventions: Other (Comment) (pt unemployed daughters support her- will send information about community assistance as needed.)  Food Insecurity:  Food Insecurity Interventions: Other (Comment) (will mail information about Equities trader)  Housing Insecurity:  Housing Interventions: Other (Comment) (will mail pt information about Museum/gallery exhibitions officer as needed)  Transportation:   Transportation Interventions: Intervention Not Indicated, Patient Resources Dietitian)     Other Care Navigation Interventions:     Provided Pharmacy assistance resources [place] Medassist; Farxiga PAP if needed   Follow-up plan:   LCSW will mail the following: my card, CAFA, NCMedAssist app and medlist, Personal assistant, [employer] application assistance and other [employer]. Will f/u with pt to ensure she receives these/answer any questions  about completion and submission of apps.

## 2023-03-27 ENCOUNTER — Telehealth: Payer: Self-pay | Admitting: Licensed Clinical Social Worker

## 2023-03-27 NOTE — Telephone Encounter (Signed)
H&V Care Navigation CSW Progress Note  Clinical Social Worker contacted patient by phone to f/u on assistance applications and community resources mailed to home. No answer today at [phone]. Unable to leave voicemail as not currently set up. Will re-attempt again as able.  Patient is participating in a Managed Medicaid Plan:  No, self pay only  SDOH Screenings   Food Insecurity: Food Insecurity Present (03/14/2023)  Housing: Low Risk  (03/14/2023)  Transportation Needs: No Transportation Needs (03/14/2023)  Utilities: Not At Risk (03/14/2023)  Financial Resource Strain: Medium Risk (03/14/2023)  Social Connections: Unknown (09/15/2021)   Received [facility], [facility]  Stress: No Stress Concern Present (12/17/2020)   Received from [facility], [facility]  Tobacco Use: Low Risk  (03/13/2023)  Health Literacy: Adequate Health Literacy (03/14/2023)   Octavio Graves, MSW, LCSW Clinical Social Worker II [facility] Heart/Vascular Care Navigation  [phone]- work cell phone (preferred) [phone]- desk phone

## 2023-03-28 ENCOUNTER — Telehealth: Payer: Self-pay | Admitting: Licensed Clinical Social Worker

## 2023-03-28 NOTE — Telephone Encounter (Signed)
H&V Care Navigation CSW Progress Note  Clinical Social Worker contacted patient again by phone to f/u on assistance applications and community resources mailed to home. No answer today at [phone]. Unable to leave voicemail as not currently set up for a second time. Tried calling twice as well to see if able to reach pt. Will re-attempt one more time as able.   Patient is participating in a Managed Medicaid Plan:  No, self pay only  SDOH Screenings   Food Insecurity: Food Insecurity Present (03/14/2023)  Housing: Low Risk  (03/14/2023)  Transportation Needs: No Transportation Needs (03/14/2023)  Utilities: Not At Risk (03/14/2023)  Financial Resource Strain: Medium Risk (03/14/2023)  Social Connections: Unknown (09/15/2021)   Received from [facility], [facility]  Stress: No Stress Concern Present (12/17/2020)   Received from [facility], [facility]  Tobacco Use: Low Risk  (03/13/2023)  Health Literacy: Adequate Health Literacy (03/14/2023)   Octavio Graves, MSW, LCSW Clinical Social Worker II [facility] Heart/Vascular Care Navigation  [phone]- work cell phone (preferred) [phone]- desk phone

## 2023-04-01 ENCOUNTER — Telehealth: Payer: Self-pay | Admitting: Licensed Clinical Social Worker

## 2023-04-01 NOTE — Telephone Encounter (Signed)
H&V Care Navigation CSW Progress Note  Clinical Social Worker contacted patient again by phone to f/u on assistance applications and community resources mailed to home. No answer again today at [phone]. Unable to leave voicemail still as not currently set up. I remain available should pt re-engage with care navigation team.    Patient is participating in a Managed Medicaid Plan:  No, self pay only  SDOH Screenings   Food Insecurity: Food Insecurity Present (03/14/2023)  Housing: Low Risk  (03/14/2023)  Transportation Needs: No Transportation Needs (03/14/2023)  Utilities: Not At Risk (03/14/2023)  Financial Resource Strain: Medium Risk (03/14/2023)  Social Connections: Unknown (09/15/2021)   [facility], [facility]  Stress: No Stress Concern Present (12/17/2020)   Received from [facility], [facility]  Tobacco Use: Low Risk  (03/13/2023)  Health Literacy: Adequate Health Literacy (03/14/2023)    Octavio Graves, MSW, LCSW Clinical Social Worker II [facility] Heart/Vascular Care Navigation  [phone]- work cell phone (preferred) [phone]- desk phone

## 2023-04-18 ENCOUNTER — Telehealth: Payer: Self-pay | Admitting: Licensed Clinical Social Worker

## 2023-04-18 NOTE — Telephone Encounter (Signed)
H&V Care Navigation CSW Progress Note  Clinical Social Worker  received resources mailed to pt back to [employer] office  noting that it was an insufficient address with no fowarding address. Unfortunately was not able to reach pt despite call several times for f/u. Will shred items and if pt is to re-engage with care navigation/Heartcare team will provide again.  Patient is participating in a Managed Medicaid Plan:  No, self pay only  SDOH Screenings   Food Insecurity: Food Insecurity Present (03/14/2023)  Housing: Low Risk  (03/14/2023)  Transportation Needs: No Transportation Needs (03/14/2023)  Utilities: Not At Risk (03/14/2023)  Financial Resource Strain: Medium Risk (03/14/2023)  Social Connections: Unknown (09/15/2021)   Received from [facility], [facility]  Stress: No Stress Concern Present (12/17/2020)   Received from [facility], [facility]  Tobacco Use: Low Risk  (03/13/2023)  Health Literacy: Adequate Health Literacy (03/14/2023)   Mandy Garza, MSW, LCSW Clinical Social Worker II [facility] Heart/Vascular Care Navigation  [phone]- work cell phone (preferred) [phone]- desk phone

## 2023-08-01 ENCOUNTER — Ambulatory Visit: Payer: Self-pay | Admitting: Hematology and Oncology

## 2023-08-01 VITALS — BP 149/79 | Wt 238.0 lb

## 2023-08-01 DIAGNOSIS — Z1211 Encounter for screening for malignant neoplasm of colon: Secondary | ICD-10-CM

## 2023-08-01 DIAGNOSIS — Z1231 Encounter for screening mammogram for malignant neoplasm of breast: Secondary | ICD-10-CM

## 2023-08-01 NOTE — Patient Instructions (Signed)
 Taught Mandy Garza about self breast awareness and gave educational materials to take home. Patient did not need a Pap smear today due to last Pap smear was in 05/22/2022 per patient. Told patient about free cervical cancer screenings to receive a Pap smear if would like one next year. Let her know [facility] will cover Pap smears every 5 years unless has a history of abnormal Pap smears. Referred patient to [facility] for screening mammogram. Appointment scheduled for 08/01/2023. Patient aware of appointment and will be there. Let patient know will follow up with her within the next couple weeks with results. Mandy Garza verbalized understanding.  Pascal Lux, NP 11:17 AM

## 2023-08-01 NOTE — Progress Notes (Unsigned)
 [name] is a 53 y.o. female who presents to [facility] clinic today with no complaints.    Pap Smear: Pap not smear completed today. Last Pap smear was 05/22/2022 and was normal. Per patient has no history of an abnormal Pap smear. Last Pap smear result is available in Epic.   Physical exam: Breasts Breasts symmetrical. No skin abnormalities bilateral breasts. No nipple retraction bilateral breasts. No nipple discharge bilateral breasts. No lymphadenopathy. No lumps palpated bilateral breasts.       Pelvic/Bimanual Pap is not indicated today    Smoking History: Patient has never smoked and was not referred to quit line.    Patient Navigation: Patient education provided. Access to services provided for patient through [facility] program. Mandy Garza interpreter provided. No transportation provided   Colorectal Cancer Screening: Per patient has never had colonoscopy completed No complaints today.    Breast and Cervical Cancer Risk Assessment: Patient does not have family history of breast cancer, known genetic mutations, or radiation treatment to the chest before age 10. Patient does not have history of cervical dysplasia, immunocompromised, or DES exposure in-utero.  Risk Scores as of Encounter on 08/01/2023     Mandy Garza as of 05/22/2022           5-year 1.18%   Lifetime 10.82%   This patient is [place]/Latina but has no documented birth country, so the [employer] model used data from [place] patients to calculate their risk score. Document a birth country in the Demographics activity for a more accurate score.         Last calculated by Mandy Garza, CMA on 05/22/2022 at 10:50 AM          A: B[facility]CP exam without pap smear No complaints with benign exam. Patient states she has had chest pain for the last week and feels "something different in her body". Strongly advised patient to be evaluated in the ED or Urgent Care. She verbalizes understanding.   P: Referred patient to  [facility] for a screening mammogram. Appointment scheduled 08/01/2023.  Pascal Lux, NP 08/01/2023 11:14 AM
